# Patient Record
Sex: Female | Born: 1940
Health system: Southern US, Community
[De-identification: ages and names within clinical notes are randomized; demographics above are authoritative.]

## PROBLEM LIST (undated history)

## (undated) DIAGNOSIS — R112 Nausea with vomiting, unspecified: Secondary | ICD-10-CM

## (undated) DIAGNOSIS — I252 Old myocardial infarction: Secondary | ICD-10-CM

## (undated) DIAGNOSIS — J84112 Idiopathic pulmonary fibrosis: Secondary | ICD-10-CM

## (undated) DIAGNOSIS — T8859XA Other complications of anesthesia, initial encounter: Secondary | ICD-10-CM

## (undated) DIAGNOSIS — I10 Essential (primary) hypertension: Secondary | ICD-10-CM

## (undated) DIAGNOSIS — T4145XA Adverse effect of unspecified anesthetic, initial encounter: Secondary | ICD-10-CM

## (undated) DIAGNOSIS — Z9889 Other specified postprocedural states: Secondary | ICD-10-CM

## (undated) DIAGNOSIS — I2510 Atherosclerotic heart disease of native coronary artery without angina pectoris: Secondary | ICD-10-CM

## (undated) DIAGNOSIS — E785 Hyperlipidemia, unspecified: Secondary | ICD-10-CM

## (undated) DIAGNOSIS — J309 Allergic rhinitis, unspecified: Secondary | ICD-10-CM

## (undated) DIAGNOSIS — K219 Gastro-esophageal reflux disease without esophagitis: Secondary | ICD-10-CM

## (undated) HISTORY — DX: Atherosclerotic heart disease of native coronary artery without angina pectoris: I25.10

## (undated) HISTORY — PX: OTHER SURGICAL HISTORY: SHX169

## (undated) HISTORY — DX: Allergic rhinitis, unspecified: J30.9

## (undated) HISTORY — DX: Hyperlipidemia, unspecified: E78.5

## (undated) HISTORY — DX: Idiopathic pulmonary fibrosis: J84.112

## (undated) HISTORY — DX: Gastro-esophageal reflux disease without esophagitis: K21.9

## (undated) HISTORY — PX: GALLBLADDER SURGERY: SHX652

## (undated) HISTORY — PX: ABDOMINAL HYSTERECTOMY: SHX81

## (undated) HISTORY — DX: Old myocardial infarction: I25.2

## (undated) HISTORY — PX: BLADDER SURGERY: SHX569

## (undated) HISTORY — DX: Essential (primary) hypertension: I10

---

## 2000-07-02 ENCOUNTER — Encounter (INDEPENDENT_AMBULATORY_CARE_PROVIDER_SITE_OTHER): Payer: Self-pay | Admitting: Specialist

## 2000-07-02 ENCOUNTER — Ambulatory Visit (HOSPITAL_COMMUNITY): Admission: RE | Admit: 2000-07-02 | Discharge: 2000-07-02 | Payer: Self-pay | Admitting: *Deleted

## 2003-04-11 ENCOUNTER — Inpatient Hospital Stay (HOSPITAL_COMMUNITY): Admission: EM | Admit: 2003-04-11 | Discharge: 2003-06-23 | Payer: Self-pay | Admitting: Cardiology

## 2003-04-11 ENCOUNTER — Encounter: Payer: Self-pay | Admitting: Cardiology

## 2003-04-12 ENCOUNTER — Encounter: Payer: Self-pay | Admitting: Thoracic Surgery (Cardiothoracic Vascular Surgery)

## 2003-04-12 ENCOUNTER — Encounter: Payer: Self-pay | Admitting: Cardiology

## 2003-04-12 ENCOUNTER — Encounter: Payer: Self-pay | Admitting: Cardiovascular Disease

## 2003-04-13 ENCOUNTER — Encounter: Payer: Self-pay | Admitting: Pulmonary Disease

## 2003-04-14 ENCOUNTER — Encounter: Payer: Self-pay | Admitting: Pulmonary Disease

## 2003-04-15 ENCOUNTER — Encounter: Payer: Self-pay | Admitting: Cardiology

## 2003-04-16 ENCOUNTER — Encounter: Payer: Self-pay | Admitting: Cardiology

## 2003-04-18 ENCOUNTER — Encounter: Payer: Self-pay | Admitting: Cardiology

## 2003-04-19 ENCOUNTER — Encounter: Payer: Self-pay | Admitting: Cardiology

## 2003-04-20 ENCOUNTER — Encounter: Payer: Self-pay | Admitting: Cardiology

## 2003-04-21 ENCOUNTER — Encounter: Payer: Self-pay | Admitting: Cardiology

## 2003-04-21 ENCOUNTER — Encounter: Payer: Self-pay | Admitting: Pulmonary Disease

## 2003-04-22 ENCOUNTER — Encounter: Payer: Self-pay | Admitting: Pulmonary Disease

## 2003-04-23 ENCOUNTER — Encounter: Payer: Self-pay | Admitting: Cardiology

## 2003-04-24 ENCOUNTER — Encounter: Payer: Self-pay | Admitting: Cardiology

## 2003-04-26 ENCOUNTER — Encounter: Payer: Self-pay | Admitting: Pulmonary Disease

## 2003-04-27 ENCOUNTER — Encounter: Payer: Self-pay | Admitting: Cardiology

## 2003-04-28 ENCOUNTER — Encounter: Payer: Self-pay | Admitting: Pulmonary Disease

## 2003-04-28 ENCOUNTER — Encounter: Payer: Self-pay | Admitting: Cardiology

## 2003-04-29 ENCOUNTER — Encounter: Payer: Self-pay | Admitting: Pulmonary Disease

## 2003-04-30 ENCOUNTER — Encounter: Payer: Self-pay | Admitting: Cardiology

## 2003-05-01 ENCOUNTER — Encounter: Payer: Self-pay | Admitting: Pulmonary Disease

## 2003-05-02 ENCOUNTER — Encounter: Payer: Self-pay | Admitting: Cardiology

## 2003-05-03 ENCOUNTER — Encounter: Payer: Self-pay | Admitting: Pulmonary Disease

## 2003-05-04 ENCOUNTER — Encounter: Payer: Self-pay | Admitting: Cardiology

## 2003-05-05 ENCOUNTER — Encounter: Payer: Self-pay | Admitting: Cardiology

## 2003-05-06 ENCOUNTER — Encounter: Payer: Self-pay | Admitting: Cardiology

## 2003-05-07 ENCOUNTER — Encounter: Payer: Self-pay | Admitting: Cardiology

## 2003-05-08 ENCOUNTER — Encounter: Payer: Self-pay | Admitting: Critical Care Medicine

## 2003-05-09 ENCOUNTER — Encounter: Payer: Self-pay | Admitting: Pulmonary Disease

## 2003-05-10 ENCOUNTER — Encounter: Payer: Self-pay | Admitting: Pulmonary Disease

## 2003-05-10 ENCOUNTER — Encounter: Payer: Self-pay | Admitting: Cardiology

## 2003-05-11 ENCOUNTER — Encounter: Payer: Self-pay | Admitting: Cardiology

## 2003-05-12 ENCOUNTER — Encounter: Payer: Self-pay | Admitting: Pulmonary Disease

## 2003-05-13 ENCOUNTER — Encounter: Payer: Self-pay | Admitting: Pulmonary Disease

## 2003-05-14 ENCOUNTER — Encounter: Payer: Self-pay | Admitting: Cardiology

## 2003-05-15 ENCOUNTER — Encounter: Payer: Self-pay | Admitting: Pulmonary Disease

## 2003-05-15 ENCOUNTER — Encounter: Payer: Self-pay | Admitting: Cardiology

## 2003-05-15 ENCOUNTER — Encounter (INDEPENDENT_AMBULATORY_CARE_PROVIDER_SITE_OTHER): Payer: Self-pay | Admitting: *Deleted

## 2003-05-16 ENCOUNTER — Encounter: Payer: Self-pay | Admitting: Cardiology

## 2003-05-17 ENCOUNTER — Encounter: Payer: Self-pay | Admitting: Cardiology

## 2003-05-18 ENCOUNTER — Encounter: Payer: Self-pay | Admitting: Cardiology

## 2003-05-19 ENCOUNTER — Encounter: Payer: Self-pay | Admitting: Cardiology

## 2003-05-19 ENCOUNTER — Encounter: Payer: Self-pay | Admitting: Pulmonary Disease

## 2003-05-20 ENCOUNTER — Encounter: Payer: Self-pay | Admitting: Cardiology

## 2003-05-21 ENCOUNTER — Encounter: Payer: Self-pay | Admitting: Cardiology

## 2003-05-21 ENCOUNTER — Encounter: Payer: Self-pay | Admitting: Pulmonary Disease

## 2003-05-22 ENCOUNTER — Encounter: Payer: Self-pay | Admitting: Cardiology

## 2003-05-23 ENCOUNTER — Encounter: Payer: Self-pay | Admitting: Pulmonary Disease

## 2003-05-24 ENCOUNTER — Encounter: Payer: Self-pay | Admitting: Cardiology

## 2003-05-25 ENCOUNTER — Encounter: Payer: Self-pay | Admitting: Cardiology

## 2003-05-26 ENCOUNTER — Encounter: Payer: Self-pay | Admitting: Cardiology

## 2003-05-27 ENCOUNTER — Encounter: Payer: Self-pay | Admitting: Cardiology

## 2003-05-28 ENCOUNTER — Encounter: Payer: Self-pay | Admitting: Cardiology

## 2003-05-29 ENCOUNTER — Encounter: Payer: Self-pay | Admitting: Critical Care Medicine

## 2003-05-30 ENCOUNTER — Encounter: Payer: Self-pay | Admitting: Cardiology

## 2003-05-31 ENCOUNTER — Encounter: Payer: Self-pay | Admitting: Cardiology

## 2003-06-01 ENCOUNTER — Encounter: Payer: Self-pay | Admitting: Cardiology

## 2003-06-02 ENCOUNTER — Encounter: Payer: Self-pay | Admitting: Pulmonary Disease

## 2003-06-02 ENCOUNTER — Encounter: Payer: Self-pay | Admitting: Cardiology

## 2003-06-03 ENCOUNTER — Encounter: Payer: Self-pay | Admitting: Cardiology

## 2003-06-04 ENCOUNTER — Encounter: Payer: Self-pay | Admitting: Cardiology

## 2003-06-05 ENCOUNTER — Encounter: Payer: Self-pay | Admitting: Cardiology

## 2003-06-06 ENCOUNTER — Encounter: Payer: Self-pay | Admitting: Cardiology

## 2003-06-07 ENCOUNTER — Encounter: Payer: Self-pay | Admitting: Cardiology

## 2003-06-08 ENCOUNTER — Encounter: Payer: Self-pay | Admitting: Cardiology

## 2003-06-08 ENCOUNTER — Encounter: Payer: Self-pay | Admitting: Critical Care Medicine

## 2003-06-21 ENCOUNTER — Encounter: Payer: Self-pay | Admitting: Critical Care Medicine

## 2003-06-23 ENCOUNTER — Inpatient Hospital Stay (HOSPITAL_COMMUNITY)
Admission: RE | Admit: 2003-06-23 | Discharge: 2003-06-30 | Payer: Self-pay | Admitting: Physical Medicine & Rehabilitation

## 2004-08-27 ENCOUNTER — Ambulatory Visit: Payer: Self-pay | Admitting: Cardiology

## 2004-08-27 ENCOUNTER — Inpatient Hospital Stay (HOSPITAL_COMMUNITY): Admission: EM | Admit: 2004-08-27 | Discharge: 2004-08-28 | Payer: Self-pay | Admitting: Cardiology

## 2004-09-20 ENCOUNTER — Ambulatory Visit: Payer: Self-pay | Admitting: Cardiology

## 2004-12-06 ENCOUNTER — Ambulatory Visit: Payer: Self-pay | Admitting: Pulmonary Disease

## 2005-03-21 ENCOUNTER — Ambulatory Visit: Payer: Self-pay | Admitting: Pulmonary Disease

## 2005-05-14 ENCOUNTER — Ambulatory Visit: Payer: Self-pay | Admitting: Cardiology

## 2007-04-08 ENCOUNTER — Ambulatory Visit: Payer: Self-pay | Admitting: Physician Assistant

## 2007-04-09 ENCOUNTER — Inpatient Hospital Stay (HOSPITAL_BASED_OUTPATIENT_CLINIC_OR_DEPARTMENT_OTHER): Admission: RE | Admit: 2007-04-09 | Discharge: 2007-04-09 | Payer: Self-pay | Admitting: Cardiology

## 2007-04-09 ENCOUNTER — Ambulatory Visit: Payer: Self-pay | Admitting: Cardiology

## 2007-04-27 ENCOUNTER — Ambulatory Visit: Payer: Self-pay | Admitting: Cardiology

## 2007-05-10 ENCOUNTER — Ambulatory Visit: Payer: Self-pay | Admitting: Pulmonary Disease

## 2008-02-25 ENCOUNTER — Telehealth (INDEPENDENT_AMBULATORY_CARE_PROVIDER_SITE_OTHER): Payer: Self-pay | Admitting: *Deleted

## 2008-03-28 DIAGNOSIS — F329 Major depressive disorder, single episode, unspecified: Secondary | ICD-10-CM | POA: Insufficient documentation

## 2008-03-28 DIAGNOSIS — J84112 Idiopathic pulmonary fibrosis: Secondary | ICD-10-CM | POA: Insufficient documentation

## 2008-03-29 ENCOUNTER — Ambulatory Visit: Payer: Self-pay | Admitting: Internal Medicine

## 2008-03-29 DIAGNOSIS — R05 Cough: Secondary | ICD-10-CM

## 2008-03-29 DIAGNOSIS — E785 Hyperlipidemia, unspecified: Secondary | ICD-10-CM

## 2008-03-29 DIAGNOSIS — I10 Essential (primary) hypertension: Secondary | ICD-10-CM | POA: Insufficient documentation

## 2008-05-05 ENCOUNTER — Ambulatory Visit: Payer: Self-pay | Admitting: Internal Medicine

## 2008-05-15 ENCOUNTER — Telehealth (INDEPENDENT_AMBULATORY_CARE_PROVIDER_SITE_OTHER): Payer: Self-pay | Admitting: *Deleted

## 2008-05-16 ENCOUNTER — Telehealth (INDEPENDENT_AMBULATORY_CARE_PROVIDER_SITE_OTHER): Payer: Self-pay | Admitting: *Deleted

## 2009-10-18 ENCOUNTER — Ambulatory Visit (HOSPITAL_COMMUNITY): Admission: RE | Admit: 2009-10-18 | Discharge: 2009-10-18 | Payer: Self-pay | Admitting: Ophthalmology

## 2009-11-01 ENCOUNTER — Ambulatory Visit (HOSPITAL_COMMUNITY): Admission: RE | Admit: 2009-11-01 | Discharge: 2009-11-01 | Payer: Self-pay | Admitting: Ophthalmology

## 2010-12-29 LAB — BASIC METABOLIC PANEL
BUN: 13 mg/dL (ref 6–23)
CO2: 29 mEq/L (ref 19–32)
GFR calc Af Amer: >60 mL/min (ref >60)
GFR calc non Af Amer: >60 mL/min (ref >60)
Potassium: 3.7 mEq/L (ref 3.5–5.1)

## 2011-02-25 NOTE — Assessment & Plan Note (Signed)
[facility]                          [name]   JEREMIAH, TARPLEY                      MRN:          [id number]  DATE:04/27/2007                            DOB:          Mar 08, 1941    PRIMARY CARDIOLOGIST:  Learta Codding, MD,FACC.   REASON FOR VISIT:  Post catheterization follow-up.   Ms. Blume is a 70 year old female, with complex past cardiac history  well delineated by Dr. Andee Lineman during most recent clinic follow-up on  April 08, 2007.  She had not been seen in follow-up since 2006 and  presented with symptoms which were worrisome for coronary artery disease  progression.  Dr. Andee Lineman recommended an elective cardiac catheterization  and, in the meanwhile, she was placed on low dose Imdur and provided  with p.r.n. nitroglycerin.  Of note, Dr. Andee Lineman also noted the patient's  prior extensive and complex hospitalization including prolonged  mechanical ventilation and subsequent tracheostomy in the setting of  acute respiratory distress syndrome.  Dr. Andee Lineman thus felt that to some  extent the patient's persistent dyspnea was related to previous acute  lung injury and that she should resume follow-up with Dr. Billy Fischer  in [place], [place].   The right/left cardiac catheterization revealed nonobstructive coronary  artery disease, restoration of left ventricular function (50% with  minimal inferobasal hypokinesis), patency of a proximal RCA stent site,  and normal right heart pressures.   Since her procedure, patient reports no further left arm discomfort  which she now attributes to having had to lift her grandchild on several  occasions.  She also reports that her exertional dyspnea seemed to  improve markedly while she was recently vacationing at [employer].  However, since her return she seems to have had some slight worsening of  this persistent exertional dyspnea.  As before, she continues to report  no  orthopnea, PND or lower extremity edema.  The patient does not  currently smoke, but does have prior history of such.   MEDICATIONS:  1. Imdur 30 daily.  2. Protonix 40 daily.  3. Enteric coated aspirin 325 daily.  4. Potassium OTC one tablet daily.  5. Metoprolol ER 50 daily.  6. Zocor 40 daily.  7. Mirtazapine 15 mg daily.   PHYSICAL EXAMINATION:  VITAL SIGNS:  Blood pressure 116/76, pulse 76 and  regular, weight 142.  GENERAL APPEARANCE:  A 70 year old female sitting upright in no  distress.  HEENT:  Normocephalic and atraumatic.  NECK:  Palpable bilateral carotid pulses without bruits; no JVD at 90  degrees.  LUNGS:  Diminished breath sounds in bases with faint, dry, rales  particularly in the right base; no wheezes.  CARDIOVASCULAR:  Regular rate and rhythm (S1 and S2), no significant  murmurs.  ABDOMEN:  Soft and nontender.  EXTREMITIES:  Right groin stable with no ecchymosis, hematoma or bruit  on auscultation.  Palpable femoral and distal pulses.  NEUROLOGIC:  No focal deficit.   A recent chest x-ray, in preparation for cardiac catheterization,  revealed no acute abnormality with some chronic scarring of the left mid  lung region.  IMPRESSION:  1. Coronary artery disease - stable.      a.     Nonobstructive coronary artery disease with continued       patency of the proximal right coronary artery stent site by recent       cardiac catheterization.      b.     Improved left ventricular function to approximately 50%.      c.     Status post acute inferior myocardial infarction with right       ventricular involvement and a cardiogenic shock in June 2004,       treated with emergent Hepacoat stenting of 100% proximal right       coronary artery.  2. Status post acute respiratory distress syndrome.      a.     Requiring prolonged mechanical ventilation/tracheostomy.  3. Hypertension.  4. History of tobacco.  5. Dyslipidemia.      a.     Followed by Dr. Reuel Boom.    PLAN:  1. No further cardiac work-up indicated at this time.  Continued      medical management is recommended with the exception of Imdur, in      light of the recent cardiac catheterization results showing no      significant obstructive disease.  Patient is also requesting      preference to not take this medication on a regular basis.  2. We will help arrange a follow-up appointment with Dr. Billy Fischer      for reevaluation of the patient's persistent dyspnea, and in light      of the fact that patient had previously been followed by him in      clinic in [place].  3. Schedule return clinic follow-up with myself and Dr. Andee Lineman in      approximately six months.      Gene Serpe, PA-C  Electronically Signed      Learta Codding, MD,FACC  Electronically Signed   GS/MedQ  DD: 04/27/2007  DT: 04/28/2007  Job #: [id number]

## 2011-02-25 NOTE — Cardiovascular Report (Signed)
[name], [name] NO.:  [id number]   MEDICAL RECORD NO.:  [id number]          PATIENT TYPE:  OIB   LOCATION:  1966                         FACILITY:  [facility]   PHYSICIAN:  Arturo Morton. Riley Kill, MD, FACCDATE OF BIRTH:  1941/08/19   DATE OF PROCEDURE:  04/09/2007  DATE OF DISCHARGE:                            CARDIAC CATHETERIZATION   INDICATIONS:  [name] is a delightful 70 year old woman who presented  4 years ago with cardiogenic shock, RV infarction, and an occluded right  coronary artery who underwent successful percutaneous stenting.  At the  time she developed acute respiratory distress syndrome and had a  complicated hospital course.  She eventually recovered.  She has had  recent left arm discomfort, and because of this she was seen by Dr.  Andee Lineman and subsequently referred for diagnostic right and left heart  catheterization.  The patient was brought to the same day cath area and  the case discussed.  She was agreeable to proceed.   PROCEDURES:  1. Right and left heart catheterization.  2. Selective coronary arteriography.  3. Selective left ventriculography.   DESCRIPTION OF PROCEDURE:  The patient was brought to the  catheterization laboratory, prepped and draped in the usual fashion.  Through an anterior puncture, the femoral vein was entered.  A 7-French  sheath was placed.  A Swan-Ganz catheter was then passed to the superior  vena cava where a saturation was obtained.  Sequential right heart  pressures were then measured including pulmonary capillary wedge  pressure.  Following this, pulmonary artery saturation was obtained.  Thermodilution cardiac outputs were then performed.  The right femoral  artery was then entered using an anterior puncture and a 4-French sheath  was placed.  A pigtail catheter was placed in the central aorta and left  ventricle.  Pressures were measured.  There was some difficulty  aspirating the left heart catheter, and  as a result this was then  removed.  A new pigtail catheter was obtained.  The new pigtail catheter  was then placed in the LV, pressures again recorded, and  ventriculography performed in the RAO projection.  She tolerated this  well.  Following this, views of the right coronary artery were obtained  with a no-toe 4-French catheter, then left coronary arteriography was  performed using eventually a JL-5 catheter for better fit.  The patient  tolerated the procedure well.  All catheters were subsequently removed  and the femoral sheath was sewn into place.  I then reviewed the films  including the new and old films with the patient's family.  She was  taken to the holding area in satisfactory clinical condition after  direct manual hemostasis.   HEMODYNAMIC DATA:  1. Right atrial pressure 5.  2. RV 28/5.  3. Pulmonary artery 27/7, mean 15.  4. Pulmonary capillary wedge 7.  5. LV 138/9.  6. Aortic 132/69, mean 95.  7. No gradient on pullback across the aortic valve.  8. Pulmonary artery saturation 58%.  9. Superior vena cava saturation 54%.  10.Aortic saturation 90%.  11.Fick cardiac output 2.5 liters per minute.  12.Fick cardiac index 1.5 liters per minute per meter squared.  13.Thermodilution cardiac output 3.8 liters per minute.  14.Thermodilution cardiac index 2.3 liters per minute per meter      squared.   ANGIOGRAPHIC DATA.:  1. Ventriculography was done in the RAO projection.  There was perhaps      trace mitral regurgitation but this did not appear to be      significant.  Overall, LV function was preserved with an ejection      fraction in excess of 50%.  There was perhaps very mild inferobasal      hypokinesis, but improved from the acute study.  The remainder of      the segmental wall motion was normal.  2. The right coronary artery is a large dominant vessel.  It is      previously stented in the mid vessel by fluoroscopy.  There is 20%      proximal narrowing  within the stent itself.  There does not appear      to be any luminal narrowing more than 30%.  The mid and distal      vessel has mild luminal irregularity but no critical stenoses.  The      PDA bifurcates.  The posterolateral branch is moderate in size.      There is no obvious critical disease in the right coronary      distribution.  3. The left main is free of critical disease.  4. The left anterior descending artery courses to the apex.  On the      RAO cranial views, there is some mild irregularity in the LAD at      the ostium of about 30%.  The proximal left anterior descending      artery, however, does not appear to be critical, and is seen in      both the LAO caudal and RAO cranial views.  The mid vessel has an      additional area of 30% narrowing which again does not appear to be      flow-limiting.  The distal LAD has mild luminal irregularity and      wraps the apex.  5. There is a ramus intermedius that has probably 30% to 40% proximal      narrowing and then opens up.  There is a secondary lesion of about      30% as well.  Critical stenosis is not noted, although the distal      vessel is fairly large in caliber compared to the proximal vessel      which I suspect is segmentally plaqued.  6. The circumflex proper provides an insignificant marginal branch      followed by a large bifurcating marginal branch.  Overlapping this      is about a 30-40% narrowing at the large branch bifurcation.  The      more inferior branch has about 20-30% narrowing distally.   CONCLUSION:  1. Recovery of left ventricular function.  2. Normal right heart pressures.  3. Patency of the previous stent.  4. Scattered disease of the left coronary system without critical      stenoses.   PLAN:  1. We will recommend followup with Dr. Andee Lineman.  2. The patient may require pulmonary evaluation.      Arturo Morton. Riley Kill, MD, [facility] Electronically Signed     TDS/MEDQ  D:  04/09/2007  T:   04/09/2007  Job:  [id number]   cc:  Noah Charon, MD,FACC  CV Laboratory

## 2011-02-25 NOTE — Assessment & Plan Note (Signed)
[facility]                          [name]   [name], [name]                      MRN:          [id number]  DATE:04/08/2007                            DOB:          January 09, 1941    HISTORY OF PRESENT ILLNESS:  The patient is a pleasant 70 year old  female with known coronary artery disease.  The patient has a history of  acute inferior wall myocardial infarction with right ventricular  involvement presenting with cardiogenic shock in 2004.  She required  emergent PCI to a proximally occluded RCA.  She also has had residual  mild-to-moderate coronary artery disease of the LAD and circumflex.  She  had a followup nuclear imaging study done which did not demonstrate  ischemia approximately 6 months later.  The patient received a [employer] HEPACOAT stent.  The patient did not have any followup since her  last presentation to our office in 2006.  At that time she was not  having any symptoms of chest pain.   The patient, however, now reports that she is concerned.  She has  developed over the last several weeks to months left arm pain on  exertion associated with chest tightness.  There is also at times  dizziness and nausea which is independent of her chest pain.  In  reviewing the chart, the patient has a longstanding history of nausea  which is intermittent and can last sometimes several days.  I am not  sure as to what workup she has received in the past.  The patient also  reports increased shortness of breath on minimal exertion.  Of note is  that this patient at the time of her presentation was critically ill.  She had a prolonged hospitalization for approximately 3 months.  She was  on prolonged mechanical ventilation and developed acute respiratory  distress syndrome requiring prolonged ventilation and tracheostomy.  She  still follows with Dr. Sung Amabile, although she has not seen him recently.  She has known  residual lung injury related to her ARDS.  She denies,  however, any orthopnea, PND.  She has no palpitations or syncope.  The  patient states that earlier this morning when coming up to our practice,  she developed 2/10 chest tightness upon exertion.  She denies, however,  any pain at rest.  EKG in the office today also demonstrates no  ischemia, no significant ST-T wave changes.  There is poor R wave  progression, however.   MEDICATIONS:  1. Mirtazapine 15 mg p.o. daily.  2. Zocor 40 mg daily.  3. Metoprolol ER 50 mg daily.  4. Potassium 550 mcg daily.  5. Enteric-coated aspirin 325 mg daily.  6. Vitamin.  7. Protonix 40 mg p.o. daily.   PHYSICAL EXAMINATION:  VITAL SIGNS:  Blood pressure 151/93, heart rate  60 beats per minute.  Weight 144 pounds.  GENERAL:  Well-nourished white female in no apparent distress.  HEENT:  Normal.  NECK:  Normal carotid upstrokes and no carotid bruits.  JVD is  approximately 6 to 7 cm.  LUNGS:  Somewhat diminished breath sounds  but no wheezing or crackles.  HEART:  Regular rate and rhythm with normal S1 and S2.  No murmurs,  rubs, or gallops.  ABDOMEN:  Soft, nontender.  No rebound or guarding.  Good bowel sounds.  EXTREMITIES:  No clubbing, cyanosis, or edema.  NEUROLOGIC:  The patient is alert and oriented.  Grossly nonfocal.   PROBLEMS:  1. Recurrent substernal chest pain.      a.     Coronary artery disease.  See details above.      b.     History of inferior wall myocardial infarction with right       ventricular involvement.      c.     Status post HEPACOAT stent.      d.     Negative nuclear imaging study 6 months post-myocardial       infarction.  2. Hypertension, poorly controlled.  3. History of tobacco use, discontinued.  4. History of acute respiratory distress syndrome and tracheostomy.  5. Dyslipidemia.   PLAN:  1. The patient symptoms are concerning for worsening ischemic heart      disease.  I gave the patient the  opportunity to proceed with stress      testing or catheterization, but I made it clear that my preference      is the latter approach.  The patient is comfortable with this, and      we will plan on a JV catheterization tomorrow.  I do not think that      she has unstable symptoms, and her symptoms are still exertional-      related.  I will provide her with Imdur and p.r.n. nitroglycerin.      I did tell the patient, however, if she has chest pain at rest      which is prolonged in nature, she will need to present herself      acutely to the emergency room.  2. Will obtain lipid panel and additional blood testing in preparation      for cardiac catheterization.  3. I do think that part of the patient's dyspnea may well be related      to her prior history of acute lung injury in the setting of      cardiogenic shock.  She will also need a followup appointment with      Dr. Sung Amabile.  Will add a right heart catheterization to the      procedure to make sure that she does not have significant pulmonary      hypertension.   I have discussed the risks and benefits of the procedure with the  patient, and she is willing to proceed.     Learta Codding, MD,FACC  Electronically Signed    GED/MedQ  DD: 04/08/2007  DT: 04/08/2007  Job #: [id number]

## 2011-02-28 NOTE — H&P (Signed)
NAME:  [name], [name]                         ACCOUNT NO.:  [id number]   MEDICAL RECORD NO.:  [id number]                   PATIENT TYPE:  INP   LOCATION:  3104                                 FACILITY:  [facility]   PHYSICIAN:  Arturo Morton. Riley Kill, M.D.             DATE OF BIRTH:  June 02, 1941   DATE OF ADMISSION:  04/11/2003  DATE OF DISCHARGE:  06/08/2003                                HISTORY & PHYSICAL   HISTORY OF PRESENT ILLNESS:  Ms. Hirth is a 70 year old female presenting to  [facility] with a 24 hour history of chest pain.  Her hemodynamics  were found to be unstable on route to the hospital and she required emergent  intubation at [facility].  She was stabilized on  transfer with vasopressors for bradycardia and hypotension.  She was acutely  transferred to [facility] in cardiogenic shock with the diagnosis  of an acute inferior myocardial infarction.  She was set up on arrival here  for emergent left heart catheterization.  Once again, the patient is  intubated and unresponsive.   ALLERGIES:  1. CODEINE.  2. SULFA.  3. PENICILLIN.   MEDICATIONS:  1. Tricor 160 mg daily.  2. Hydrochlorothiazide 25 mg daily.  3. Premarin 1.25 mg daily.  4. Lisinopril 10 mg daily.  5. Zetia 10 mg daily.   PAST MEDICAL HISTORY:  1. History of anemia.  2. Hypertension.  3. Dyslipidemia.  4. COPD with history of tobacco habituation.  5. Heart murmur.   PAST SURGICAL HISTORY:  1. Status post cholecystectomy 12 years ago.  2. Status post bladder suspension in 1999.  3. Status post tumor resection, left breast.   SOCIAL HISTORY:  The patient is currently laid off.  She is married.  She  has a history of smoking.   FAMILY HISTORY:  Mother died in her early 37s of old age.  Father has a  history of Alzheimer's.  She had one brother who died of complications of  liver problems.  One sister died with a history of cancer.   REVIEW OF SYSTEMS:  Unable  to obtain from the patient at this time as she is  intubated.   PHYSICAL EXAMINATION:  VITAL SIGNS:  Weight 154, white cells 19.5,  hemoglobin 9.2, hematocrit 26.9, platelets 297.  PT is 14.8, INR 1.4,  glucose 274, potassium 3.2.  She has been given aspirin and Plavix,  Integrilin as well, and has been started on IV heparin 800 units per hour.  Again, review of systems is unobtainable since the patient is intubated.  EXTREMITIES:  Pulses are intact.  ABDOMEN:  Soft.  HEART:  She has intermittent pacing.  Blood pressure is in the 60s systolic.   IMPRESSION:  1. Acute inferior wall myocardial infarction.  2. Cardiogenic shock/hypotension.  3. History of hypertension.  4. History of tobacco habituation.  5. Dyslipidemia.   PLAN:  Emergent left  heart catheterization with hemodynamic support intra  and post catheterization.     Maple Mirza, P.A.                    Arturo Morton. Riley Kill, M.D.    GM/MEDQ  D:  09/12/2003  T:  09/12/2003  Job:  [id number]

## 2011-02-28 NOTE — Discharge Summary (Signed)
NAME:  [name], [name]               ACCOUNT NO.:  [id number]   MEDICAL RECORD NO.:  [id number]          PATIENT TYPE:  INP   LOCATION:  6527                         FACILITY:  [facility]   PHYSICIAN:  Willa Rough, M.D.     DATE OF BIRTH:  15-Aug-1941   DATE OF ADMISSION:  08/27/2004  DATE OF DISCHARGE:  08/28/2004                                 DISCHARGE SUMMARY   DISCHARGE DIAGNOSIS:  Chest pain with negative cardiac enzymes status post  cardiac catheterization showing mild to moderate coronary atherosclerosis  with patent stent site in proximal right coronary artery, approximate 60%  stenosis in mid circumflex, 50% stenosis involving a high diagonal branch,  30% stenosis in the left anterior descending, also somewhat hazy appearance  to the mid circumflex stenosis, normal left ventricular ejection fraction in  the 70% range in the setting of significant ventricular ectopy.   PAST MEDICAL HISTORY:  Acute inferior myocardial infarction, acute  respiratory distress syndrome resulting in tracheotomy in September 2004,  multiple medical problems resulting in deconditioning, history of  nutritional complications secondary to above diagnoses, metabolic  encephalopathy and steroid-induced diabetes, hyperlipidemia, anemia,  cardiogenic shock in June 2004, COPD with history of tobacco abuse, status  post cholecystectomy 12 years ago, status post bladder suspension in 1999,  status post tumor resection left breast.   HISTORY OF PRESENT ILLNESS:  This is a 70 year old Caucasian female with a  history of known CAD as stated above, who presented the day prior to  admission to [facility] with bilateral arm pain which woke  her from sleep, progressive chest pressure and tightness described as 10/10.  Her first set of enzymes were negative.  It was felt best that the patient  be transferred to [facility] due to her critical past  medical history recently.   HOSPITAL COURSE:  The patient was transferred to [facility] under the care of Dr. Myrtis Ser and scheduled for cardiac cath on  November 15, was placed on an Integrilin drip, continued Imdur, Protonix,  Zocor, prednisone, vitamins, albuterol and Lopressor.  The patient was  cathed by Dr. Diona Browner, results as stated above.  Dr. Diona Browner reviewed the  films with Dr. Antoine Poche.  It was felt that there was a transient plaque  rupture event in the mid circumflex, although cardiac markers were negative.  The residual stenosis did not appear to be critical enough to be causing  rest symptoms at present.  The plan was to continue to observe the patient,  discontinue the Integrilin, begin heparin six hours after sheath was pulled,  and follow up with serial cardiac markers.  D-dimer was also ordered.  Lab  work showed a D-dimer of 0.32, troponin less than 0.01, potassium 3.9, BUN  13, creatinine 0.8, glucose 96.  White blood cell count 9.2, hemoglobin  11.6, hematocrit 33.2, platelet count 233,000.  Dr. Diona Browner felt we might  consider outpatient Cardiolite in the future if the patient continues to  have problems.  On November 16 the cath site in the right groin showed no  hematoma,  enzymes negative x2 here, negative x1 at [facility].  D-dimer was  within normal limits.  The patient states she feels much better.  Dr. Myrtis Ser  was in to see the patient.  The plan is to ambulate the patient and if she  tolerates without any discomfort we will discharge her home later today.   DISCHARGE MEDICATIONS:  Zocor 40 mg daily, Protonix 40 mg daily, Lopressor  50 mg daily, Imdur 30 mg daily, prednisone 5 mg daily, albuterol inhalers as  needed as prior to admission, enteric-coated aspirin 325 mg daily.  The  patient is to continue other medications including Remeron, vitamins, etc.  as before.  Pain management:  Tylenol for general discomfort, nitroglycerin  for chest discomfort.   ACTIVITY:  No  driving or strenuous activity for two days.  No lifting over  10 pounds for one week.   DIET:  She is to follow a low fat, low salt diet.   SPECIAL INSTRUCTIONS:  Wound care:  Gently clean the cath site with soap and  water.  No tub bathing for one week.  She is to call our office for any  fever, pain or swelling at the cath site.   FOLLOWUP:  She has a followup appointment at our [place], [place] office  in 1-2 weeks.  At that time, if the patient is still having problems, we  will schedule an outpatient Cardiolite.       MB/MEDQ  D:  08/28/2004  T:  08/28/2004  Job:  [id number]

## 2011-02-28 NOTE — Consult Note (Signed)
NAME:  [name], [name]                         ACCOUNT NO.:  [id number]   MEDICAL RECORD NO.:  [id number]                   PATIENT TYPE:  INP   LOCATION:  2922                                 FACILITY:  [facility]   PHYSICIAN:  Charlynne Pander, D.D.S.          DATE OF BIRTH:  [birth year]   DATE OF CONSULTATION:  05/19/2003  DATE OF DISCHARGE:                                   CONSULTATION   REQUESTING PHYSICIAN:  Tawny Asal, MD   [name] is a 70 year old female referred by Dr. Tawny Asal  for dental consultation.  The patient was admitted on April 11, 2003, with an  acute myocardial infarction.  The patient received a cardiac catheterization  and angioplasty and stenting of the right coronary artery on April 11, 2003.  The patient subsequently experienced ventilator-dependent respiratory  failure and has now had a tracheostomy placed on April 27, 2003, by Dr.  Pollyann Kennedy.  Dental consultation was requested for evaluation of the patient's  dentition.   PAST MEDICAL HISTORY:  1. Coronary artery disease.     a. Status post acute myocardial infarction on April 11, 2003.     b. Status post cardiac catheterization with Dr. Riley Kill which revealed        acute inferior myocardial infarction complicated by cardiogenic shock        due to probable biventricular infarction.  Patient with 100% occlusion        of the right coronary artery which was treated with angioplasty and        stenting.  Ejection fraction was 50-55% at that time.  2. History of anemia.  3. Hypertension.  4. History of dyslipidemia.  5. Chronic obstructive pulmonary disease.  6. History of thrombocytopenia - this admission.  7. Heart murmur.  8. Status post cholecystectomy approximately 12 years ago.  9. Status post bladder tack surgery in 1999.  10.      Status post tumor resection of the breast.   ALLERGIES/ REACTIONS:  1. CODEINE.  2. SULFA.  3. PENICILLIN.   MEDICATIONS:  1. Aspirin 325 mg  daily.  2. Regular insulin per sliding scale.  3. Prevacid 30 mg every 12 hours.  4. Iron sulfate 300 mg three times daily.  5. Plavix 75 mg daily.  6. Lovenox 40 mg daily.  7. Zocor 20 mg every evening.  8. Lopressor 25 every six hours.  9. Lantus insulin 50 units at bedtime.  10.      Xopenex per inhalation therapy every 8 hours.  11.      Cromolyn sodium inhalation therapy every 8 hours.  12.      Solu-Medrol 40 mg every 12 hours.  13.      Xanax 0.25 mg every six hours.  14.      Remeron 30 mg at bedtime.  15.      Fentanyl patch 25 mcg every 72 hours.  16.  Cipro 400 mg IV every 8 hours.  17.      Jevity per orders.   SOCIAL HISTORY:  The patient is married.  Patient with a history of smoking.   FAMILY HISTORY:  Mother died in her 61s of old age.  Father with a history  of Alzheimer's.   FUNCTIONAL ASSESSMENT:  The patient remains dependent for all ADLs at this  time.   REVIEW OF SYSTEMS:  This is reviewed from the chart and health history  assessment form from this admission.   DENTAL HISTORY:  CHIEF COMPLAINT:  Dental consultation, request evaluation  of dentition.   HISTORY OF PRESENT ILLNESS:  The patient with acute myocardial infarction  and is currently ventilator-dependent.  Patient with a previous  tracheostomy.  Dental consultation was requested for evaluation of poor  dentition.   The patient is unable to provide dental history.  The patient's husband is  present in the room and indicates that the patient's last dental treatment  was approximately one year ago for a dental cleaning.  The patient's husband  is unaware of any unmet dental needs.  The patient's husband indicates that  she has had a previous upper denture.   DENTAL EXAMINATION:  GENERAL:  The patient is a well-developed, well-  nourished female lying in the hospital bed with tracheostomy in place.  HEAD/NECK:  There is no significant lymphadenopathy noted at this time.  INTRAORAL EXAM:   The patient has normal saliva.  There is no evidence of  abscess formation.  All soft tissues appear to be healthy at this time.  There is no evidence of soft tissue pathology.  PERIODONTAL:  Patient with evidence of generalized interval recession and  chronic periodontitis.  Patient with some plaque accumulations, although it  is minimal and the oral hygiene is relatively good considering the patient's  medical condition.  There is incipient tooth mobility of the mandibular  anterior teeth but no significant tooth mobility which would relate to risk  for aspiration.  DENTAL CARIES:  Dental caries are noted to be affecting the lower right  premolar.  This does not appear to be very extensive and could be evaluated  in the future once the patient is more medically stable.  DENTITION:  Patient with multiple missing teeth.  The patient is missing all  maxillary teeth and multiple teeth on the lower with the exception of six  mandibular anterior teeth.   ASSESSMENT:  1. No significant risk for aspiration at this time.  2. Chronic periodontitis with bone loss.  3. Incipient tooth mobility in the mandibular anterior teeth.  4. Incipient dental caries of the lower right premolar.  5. Multiple missing teeth.  6. Generalized relatively good oral hygiene.  7. The patient is edentulous on the maxilla.  8. Questionable history of previous denture fabrication.    PLAN/RECOMMENDATION:  1. I discussed the risks, benefits and complications with the patient's     husband and the nurse.  The patient is at minimal risk for aspiration at     this time due to tooth mobility.  The dental caries affecting the patient     should be amenable to treatment and once the patient is medically stable.     This tooth is not at risk for fracturing due to the dental caries.  There     is no evidence of abscess formation at this time.  There is no evidence    of risk for a dental abscess.  The patient can be  monitored and if acute     problems do arise, Dental Medicine can be re-consulted for evaluation for     acute dental needs.  In the meantime, I would maintain oral hygiene as     best possible using chlorhexidine rinses and assistance in maintenance of     the oral hygiene as best possible.  2. Discussion of findings with medical team as indicated.  3. Please re-consult Dental Medicine if acute dental problems arise for the     rest of this admission.  The patient should follow up with a private     dentist of her choice for evaluation once medically stable.  Will assist     in referral to another dentist as indicated.                                               Charlynne Pander, D.D.S.    RFK/MEDQ  D:  05/19/2003  T:  05/20/2003  Job:  [id number]   cc:   Tawny Asal, MD

## 2011-02-28 NOTE — Op Note (Signed)
   NAME:  [name], MCGHIE                         ACCOUNT NO.:  [id number]   MEDICAL RECORD NO.:  [id number]                   PATIENT TYPE:  INP   LOCATION:  0160                                 FACILITY:  [facility]   PHYSICIAN:  Lina Sar, M.D. [facility]               DATE OF BIRTH:  [birth year]   DATE OF PROCEDURE:  DATE OF DISCHARGE:                                 OPERATIVE REPORT   PROCEDURE:  Percutaneous endoscopic gastrostomy.   INDICATIONS:  This 70 year old white female with severe neurological deficit  has been depending on tube feedings.  The plans were made for long-term  nutritional support through percutaneous gastrostomy because of inability to  swallow.  She is undergoing placement of PEG after discussing this with her  family.   ENDOSCOPE:  Olympus single-channel videoendoscope.   SEDATION:  Versed 7 mg IV, fentanyl 75 mcg IV.   FINDINGS:  Olympus single-channel videoscope passed routinely through the  posterior pharynx into esophagus.  The patient was monitored by pulse  oximetry.  She was on the ventilator.  Her respirations were regular.  The  esophageal mucosa was normal.  The squamocolumnar junction was unremarkable.  There was a small hiatal hernia.  Gastric folds were unremarkable.  Pyloric  outlet and duodenum were wide open.  Minor erythema was noted in the  descending duodenum.   Skin was infiltrated with 1% Xylocaine after translumination of the  abdominal bulb with an endoscope.  After topical anesthetic, a small  incision was made in the skin and Seldinger needle passed into the gastric  lumen under direct vision.  Through the Seldinger needle guidewire was  placed and pulled back through the endoscope and through the esophagus  outside of the mouth.  The gastrostomy tube was then placed over the  guidewire with the tapered end first and advanced through the stomach and  through the gastrostomy opening out.  Mushroom ________over the gastrostomy  tube was  snug against the abdominal wall.  Retention disks and adapters were  placed.  The patient tolerated the procedure well.   IMPRESSION:  Placement of 24-French percutaneous gastrostomy.   PLAN:  Routine orders have been written for antireflux measures, skin care,  as well as resumption of the tube feedings tomorrow morning.                                               Lina Sar, M.D. [facility]    DB/MEDQ  D:  06/13/2003  T:  06/13/2003  Job:  [id number]

## 2011-02-28 NOTE — Cardiovascular Report (Signed)
NAME:  [name], [name]                         ACCOUNT NO.:  [id number]   MEDICAL RECORD NO.:  [id number]                   PATIENT TYPE:  INP   LOCATION:  2861                                 FACILITY:  [facility]   PHYSICIAN:  Arturo Morton. Riley Kill, M.D.             DATE OF BIRTH:  Mar 31, 1941   DATE OF PROCEDURE:  04/11/2003  DATE OF DISCHARGE:                              CARDIAC CATHETERIZATION   INDICATIONS:  The patient is a 70 year old who has been under significant  stress.  Yesterday they buried the patient's son.  She had discomfort in her  arms throughout the day as well as some shortness of breath.  This morning  she was found poorly responsive by her daughter, brought into the emergency  room, where she was emergently intubated.  She was taken to the emergency  room at [facility], where she was found to be in heart block and acute  inferior MI.  A temporary transvenous pacer was attempted to be passed  there.  She was subsequently transferred emergently to the [facility]  catheterization lab.   PROCEDURES:  1. Left heart catheterization.  2. Selective coronary arteriography.  3. Selective left ventriculography.  4. Intra-aortic balloon pump insertion.  5. Temporary transvenous pacer insertion.  6. Percutaneous transluminal coronary angioplasty and stent of the right     coronary artery.   DESCRIPTION OF PROCEDURE:  The patient was brought to the catheterization  lab and prepped and draped in the usual fashion.  The internal jugular  pacemaker was not capturing.  Through a right femoral vein approach, a 5  [place] Daig pacing wire was passed to the RV apex.  With this there was good  contact, and subsequently the patient was paced with good capture.  Right  femoral artery was then entered.  ACTs were checked.  Then a 7 French sheath  was placed.  Views of the right and left coronary arteries were obtained.  Ventriculography was then obtained.  Preparations were then made for intra-  aortic balloon pump insertion because of what appeared to be right  ventricular infarction with cardiogenic shock.  Intravenous fluids were  turned up to 500 mL/hr.  We slowly began to attempt to taper the dopamine  and Levophed which the patient had come down on.  Intra-aortic balloon pump  was inserted without difficulty.  The 34 mL pump was placed.  This was  instituted at 1:1.  Integrilin was given according to protocol.  The patient  had not had aspirin nor Plavix. Using a JR4 guiding catheter, the right  coronary artery was entered and the lesion was then easily crossed with a  0.014 high-torque floppy wire.  We then opened the artery with the 2 mm  balloon and subsequently a 28 x 2.5 [employer] Hepacoat stent was  then placed across the area and taken up to 14 atmospheres.  This was then  post-dilated using a 2.75 PowerSail balloon.  There was marked improvement  in the appearance of the artery with establishment of TIMI-3 flow.  There  was some improvement in rhythm and gradual resumption of some sinus rhythm.  The patient was having short runs of nonsustained VT.  We were able to cut  down on the dopamine and Levophed to some extent, and we continued the intra-  aortic balloon pumping.  Heparin had been given according to protocol and  ACTs checked.  We gave a tiny dose of intravenous beta blockade to try to  alleviate some of the arrhythmia.  The patient remained intubated.  Blood  gases were checked.  The guiding catheter was subsequently removed.  Sheaths  were sewn into place, as was the temporary wire.  The patient was taken to  the catheterization lab holding area in critical but slightly improved  condition.   HEMODYNAMIC DATA:  1. Initial central aortic pressure 100/57, mean 51 with pacing.  2. Left ventricle 82/16.  3. No gradient on pullback across the aortic valve.   ANGIOGRAPHIC DATA:  1. Ventriculography was performed in the RAO projection.  There was      inferobasal hypokinesis with ejection fraction estimated at 50-55%.     There did not appear to be significant mitral regurgitation.  2. The left main was free of critical disease.  3. The LAD coursed to the apex.  There was probably 30% segmental plaquing     in the mid-LAD.  It was difficult to be absolutely certain because of the     degree of vasoconstriction associated with the patient's dopamine and     Levophed.  4. There was a ramus intermedius that appeared to have about 70% mid-     segmental plaquing.  5. The circumflex appeared to have about 30-40% overlying the origin of the     AV circumflex leading into the marginal branch.  6. The right coronary artery was totally occluded proximally.  There was     faint collateralization distally.  Following reperfusion therapy, the     100% was reduced to 0% with the 2.5 mm stent taken up to 2.82.  There was     TIMI-0 flow at the beginning of the procedure and TIMI-3 flow at the     completion of the procedure.   CONCLUSION:  1. Acute inferior myocardial infarction complicated by cardiogenic shock due     to probable right ventricular infarction.  2. Undefined anemia.  3. Hypertension.  4. Dyslipidemia.  5. Smoker.  6. Increased stress.   PLAN:  1. Temporary pacer and intra-aortic balloon pump as described above.  2. Reperfusion therapy.  3. Discontinuation of smoking.  4. Cardiac rehab.   DISCUSSION:  The patient is in cardiogenic shock, hopefully will continue to  improve.  We will try to give fluids as a first-line measure and try to back  off on dopamine as tolerated.                                               Arturo Morton. Riley Kill, M.D.    TDS/MEDQ  D:  04/11/2003  T:  04/11/2003  Job:  [id number]  Donzetta Sprung  [address], [address]  [place]  [place] [address]  Fax: [phone]   Jonelle Sidle, M.D. [facility]   Cardiovascular  Laboratory   cc:   Donzetta Sprung [address], [address]  [place]  [place] [address]  Fax:  [phone]   Jonelle Sidle, M.D. [facility]   Cardiovascular Laboratory

## 2011-02-28 NOTE — Discharge Summary (Signed)
NAME:  [name], [name]                         ACCOUNT NO.:  [id number]   MEDICAL RECORD NO.:  [id number]                   PATIENT TYPE:  INP   LOCATION:  3104                                 FACILITY:  [facility]   PHYSICIAN:  Shan Levans, M.D. [facility]            DATE OF BIRTH:  June 04, 1941   DATE OF ADMISSION:  04/11/2003  DATE OF DISCHARGE:  06/08/2003                                 DISCHARGE SUMMARY   INTERIM DISCHARGE SUMMARY:   INTERIM DISCHARGE DIAGNOSES:  1. Prolonged ventilator-dependent respiratory failure secondary to acute     myocardial infarction with right ventricular infarct.  2. Fibroproliferative acute respiratory distress syndrome.  3. Ventilator-acquired pneumonia with Escherichia coli.  4. Severe debility secondary to prolonged hospitalization.   HISTORY OF PRESENT ILLNESS:  Ms. [name] is a 70 year old white female  who presented to [facility] on April 11, 2003, with a 24-hour history  of chest pain.  She was found to be cardiovascularly and hemodynamically  unstable, and she required intubation via emergency department and was  treated with vasopressors for bradycardia and hypotension.  She was acutely  transferred to [facility] via [facility] mobile critical care service  and was unstable.  She required urgent cardiac catheterization with stent of  the RCA and was found to be severely hypotensive, in cardiogenic shock,  required an intra-aortic balloon pump along with vasopressors, admission to  the intensive care unit, and has vent-dependent respiratory failure  secondary to cardiac function.  Of note, she is now on day 48 of  hospitalization.  She has been in prolonged vent-dependent respiratory  failure and has never been successfully liberated from the ventilator, and  will be transferred to the progressive pulmonary unit for long-term  liberation from mechanical ventilatory support.   LABORATORY DATA:  Sodium 141, potassium 3.7,  chloride 99, CO2 of 33, glucose  109, BUN 11, creatinine 0.3, calcium 9.3.  WBC 20.3, hemoglobin 9.1,  hematocrit 26.9, and platelets were 319.  Arterial blood gas on 40% FIO2  with a rate of 16 on pressure control mode, pH 7.45, PCO2 of 45, PO2 of 166  with a bicarb of 31.7.  INR is 1.0, PT is 13.1.  CK 17, CK-MB 1.4, troponin  I 0.02.  Respiratory culture on June 02, 2003, shows abundant E. coli that  is pansensitive.  Atypical cells from status post left thoracentesis on  May 15, 2003, demonstrates atypical cells.  Chest x-ray on June 06, 2003, demonstrates stable appearance of chest, support apparatus unchanged.  Bilateral infiltrates, left greater than right, unchanged.   PROCEDURES:  1. Oral endotracheal intubation, June 29-April 27, 2003, with a tracheostomy     placed, a #6 Shiley, on April 27, 2003, by Dr. Pollyann Kennedy, which remains in     place.  2. Left femoral intra-aortic balloon pump, June 29-April 13, 2003.  3. Right femoral artery arterial line from June 29-April 13, 2003.  4. Femoral vein __________ , June 29-April 16, 2003.  5. Right internal jugular from June 27-30, 2004.  6. Right Swan-Ganz catheter from June 30-April 16, 2003, secondary right     subclavian Ernestine Conrad from July 28-31, 2004.  7. Fiberoptic bronchoscopy on May 02, 2003.  8. Ultrasound-guided thoracentesis.  9. She also had cardiac catheterization and stenting of the RCA on     admission.   MICROBIOLOGY DATA:  Sputum culture on June 29 demonstrated Streptococcus  pneumonia, treated with Rocephin and Zosyn.  Blood cultures, she also was  treated briefly with vancomycin until culture data confirmed on methicillin-  resistant Staphylococcus aureus.  She also had on April 24, 2003, after  fiberoptic bronchoscopy 6000 units of Enterobacter.  She was treated with  Primaxin and vancomycin, with Primaxin being stopped after three days and  vancomycin being stopped after four days, changed to Cipro until complete.  EP  culture on August 20 with E. coli, currently on day six of seven days of  Cefepime.   HOSPITAL COURSE BY DISCHARGE DIAGNOSIS:  1. PROLONGED VENTILATOR-DEPENDENT RESPIRATORY FAILURE:  Ms. [name]     was admitted to [facility] after transfer from [facility]     April 11, 2003.  She had a difficult course that required her to remain on     mechanical ventilatory support secondary to acute MI with inferior and RV     infarcts.  She required intra-aortic balloon pump and vasopressor     support.  After prolonged vent-dependent respiratory support, she did     develop fibroproliferative acute respiratory distress syndrome, which was     challenged x2 with steroids and proved nonresponsive.  She continues to     remain mechanical and ventilatory support-dependent.  She is on a PRVC of     rate of 20 with a tidal volume of 420 mL, which equals 7 mL/kg, with an     FIO2 of 40% and 5 of PEEP.  She will remain on those mechanical     ventilatory settings until transferred to the progressive pulmonary unit,     at which time further adjustments will be made toward weaning her and     liberating her from mechanical ventilatory support.  2. DIABETES MELLITUS:  She has diabetes mellitus when in the hospital.  This     is being addressed with sliding scale insulin and q.12h. insulin.  3. CHRONIC CRITICAL ILLNESS POLYNEUROPATHY:  She remains extremely weak.     After being transferred to the progressive pulmonary unit, she will be     treated with occupational and physical therapy to increase her strength.  4. HOSPITAL-ACQUIRED ESCHERICHIA COLI:  Is on day six of seven of treatment     with Maxipime.  She will receive one more day of Maxipime and it will be     discontinued.  She is on a short course of antimicrobial therapy.  5. TOXIC METABOLIC ENCEPHALOPATHY AND AGITATION:  This has been addressed    with Xanax and Duragesic patch while in the acute setting.  This will be      re-evaluated with a questionable wean from Xanax and the initiation of     benzodiazepines.  Again, this will be addressed once she is in the     progressive pulmonary unit.   DISCHARGE MEDICATIONS:  1. Aspirin 325 mg daily.  2. Prevacid q.12h. via tube.  3. Ferrous sulfate 300 mg t.i.d.  4.  Lovenox 40 mg daily.  5. Zocor 20 mg daily.  6. Xopenex 1.25 mg q.8h. via nebulizer.  7. Remeron 30 mg q.h.s.  8. Duragesic patch q.72h.  9. Xanax 0.25 mg q.6h.  10.      Insulin sliding scale:  Less than 150, no coverage; 150-199, 6     units; 200-299, 12 units; 300-399, 14 units; greater than 400, 16 units.  11.      Prednisone 20 mg a day.  12.      NPH insulin q.12h. 20 units.  13.      Lopressor 37.5 mg q.8h.  14.      She is on Aricept 100 mcg every 7 days.  15.      Zinc oxide to topical application.  16.      Maxipime 1 g IV q.12h. on day 6 for one more day, which will be     discontinued after the dose on June 09, 2003.  17.      She is on tube feeding, Jevity 1.2 calories, at 50 mL/hr.  18.      She has Senokot S tablets q.h.s. p.r.n.  19.      Dulcolax suppository p.r.n.  20.      Haldol 2 mg IV q.2-4h. p.r.n.  21.      Lorazepam 1-4 mg q.4-6h. p.r.n. agitation.  22.      She will also have fentanyl 25-75 mcg IV q.4h. p.r.n.   Her diet as noted.  She is being transferred to the progressive pulmonary  unit, to be followed up and hopefully be liberated from mechanical  ventilatory support.      Brett Canales Minor, A.C.N.P. [facility]                 Shan Levans, M.D. [facility]    SM/MEDQ  D:  06/08/2003  T:  06/08/2003  Job:  [id number]   cc:   Dr. Delaine Lame, [facility] ED   [place] Bing, M.D.   Arturo Morton. Riley Kill, M.D.   Wilhemina Bonito. Marina Goodell, M.D. [facility]   Jefry H. Pollyann Kennedy, M.D.  [address] [place]  [place] [address]  Fax: [phone]

## 2011-02-28 NOTE — Discharge Summary (Signed)
NAME:  [name], [name]                         ACCOUNT NO.:  [id number]   MEDICAL RECORD NO.:  [id number]                   PATIENT TYPE:  IPS   LOCATION:  4036                                 FACILITY:  [facility]   PHYSICIAN:  [name], M.D.           DATE OF BIRTH:  1940-10-14   DATE OF ADMISSION:  06/23/2003  DATE OF DISCHARGE:  06/30/2003                                 DISCHARGE SUMMARY   DISCHARGE DIAGNOSES:  1. Deconditioning, multiple medical.  2. Acute inferior myocardial infarction.  3. Acute respiratory distress syndrome and tracheotomy, decannulated     June 21, 2003.  4. Decreased nutritional stores.  5. Metabolic encephalopathy.  6. Steroid-induced diabetes mellitus.  7. Escherichia coli urinary tract infection, resolved.  8. Tachycardia secondary to deconditioning.  9. Hyperlipidemia.  10.      Anemia.   HISTORY OF PRESENT ILLNESS:  [age]-year-old female, initially admitted  to April 11, 2003 to [facility] with chest pain, shortness of breath  and altered mental status.  She was intubated in the emergency department.  EKG with heart block and acute inferior myocardial infarction.  She was  transferred to [facility] for cardiac catheter per Dr. Arturo Morton.  [place], findings of acute inferior myocardial infarction complicated by  cardiogenic shock due to probable right ventricular infarction.  Temporary  pacer was placed and intra-aortic balloon pump.  Hospital course complicated  by mechanical ventilator dependence, tracheotomy, April 27, 2003.  Critical  illnesses of polyneuropathy, toxic metabolic encephalopathy, increased blood  sugars that were steroid induced.  She was treated for an E. coli urinary  tract infection.  She received a gastrostomy feeding tube, June 14, 2003, per Dr. Lina Sar, for nutritional storage.  Swallow study,  June 21, 2003; diet advanced to mechanical soft.  She required minimal  assist for  ambulation.  Latest chemistries unremarkable.  Admitted for a  comprehensive rehab program.   PAST MEDICAL HISTORY:  See discharge diagnoses.   PAST SURGICAL HISTORY:  1. Bladder tacking.  2. Tumor resection from her breast that was benign.  3. Cholecystectomy.   ALLERGIES:  Allergies are CODEINE, SULFA and PENICILLIN.   PRIMARY MEDICAL DOCTOR:  Her primary M.D. is Dr. Chestine Spore of [place].   SOCIAL HISTORY:  Lives with husband in [place], independent prior to admission  with one-level home, two steps to entry; husband and daughter to assist on  discharge.   MEDICATIONS PRIOR TO ADMISSION:  Family were to provide.   HOSPITAL COURSE:  Patient with progressive gains while in rehab services  with therapies initiated on a twice-daily (b.i.d.) basis.  The following  issues were followed during the patient's rehab course:  Pertaining to Ms.  Sensing's deconditioning, long hospital course complicated by multiple medical  issues.  She continued to participate fully in all areas of therapy,  strength grossly graded at 4 to 5-/5.  She had no chest pain  or shortness of  breath.  She had undergone aortic balloon pump per cardiology services, Dr.  Riley Kill.  Her tracheostomy site was healing nicely, as she had been  decannulated on June 21, 2003.  Diet advanced to mechanical soft.  She  would follow up with Dr. Lina Sar for removal of gastrostomy feeding tube  that was placed on June 14, 2003.  She was cleared by speech therapy,  felt to be plateaued on cognitive levels, family to provide supervision at  home.  Blood pressures remained controlled on Lopressor.  No  gastrointestinal complaints on Protonix.  She was weaned from her prednisone  therapy and followup with pulmonary services.  She was advised no driving,  no smoking, no alcohol.  She was ambulating throughout the rehab unit, with  family teaching provided.  She was supervision with a rolling walker.  She  was discharged to home with  [facility] cardiac rehab arranged.   Latest labs showed a hemoglobin of 11.3, hematocrit 34, sodium 141,  potassium 3.6, BUN 18, creatinine 0.5.   DISCHARGE MEDICATIONS:  1. Ecotrin 325 mg daily.  2. Klonopin 1 mg twice daily.  3. Lopressor 25 mg every 12 hours.  4. Remeron 30 mg at bedtime.  5. Protonix 40 mg daily.  6. Prednisone taper as advised.  7. Zocor 20 mg daily.  8. Multivitamin daily.  9. Albuterol inhaler as needed.   ACTIVITY:  Activity as tolerated.   DIET:  Diet as tolerated.   SPECIAL DISCHARGE INSTRUCTIONS:  No driving, no smoking, no alcohol.    FOLLOWUP:  She should follow up with Dr. Lina Sar for removal of PEG tube  in six weeks, [facility] cardiac rehab arranged.      Mariam Dollar, P.A.                     [name], M.D.    DA/MEDQ  D:  07/03/2003  T:  07/03/2003  Job:  [id number]   cc:   [name], M.D.  [address] [place]  [place] [address]  Fax: [phone]   Shan Levans, M.D. [facility]   Maisie Fus D. Riley Kill, M.D.

## 2011-02-28 NOTE — Cardiovascular Report (Signed)
[name], Carol NO.:  [id number]   MEDICAL RECORD NO.:  [id number]          PATIENT TYPE:  INP   LOCATION:  2852                         FACILITY:  [facility]   PHYSICIAN:  Jonelle Sidle, M.D. LHCDATE OF BIRTH:  1941/05/26   DATE OF PROCEDURE:  DATE OF DISCHARGE:                              CARDIAC CATHETERIZATION   PRIMARY CARE PHYSICIAN:  Donzetta Sprung, M.D.   CARDIOLOGIST:  Jonelle Sidle, M.D. [facility].   INDICATIONS:  [name] is a pleasant 70 year old woman with a history of  hypertension, dyslipidemia, and coronary artery disease, status post  inferior wall myocardial infarction, with right ventricular involvement and  cardiogenic shock back in June 2004.  At that time, she underwent emergent  intervention with stent placement to the proximal right coronary artery, and  subsequently had a prolonged hospital course, with ventilator-dependent  respiratory failure due to fibroproliferative adult respiratory distress  syndrome.  She made a significant improvement and has been followed  medically since that time.  She presented to the [facility] Emergency  Department today with a week-long history of intermittent chest and arm  discomfort.  Her electrocardiogram showed nonspecific ST-T wave changes,  with no ST elevation in the standard or right ventricular leads, and  initially her troponin I level was negative.  She did become relatively  hypotensive on nitroglycerin, with systolic blood pressure down into the  80s, and given her presentation was transferred urgently to the cardiac  catheterization lab at [facility] by Dr. Myrtis Ser.  She is now to  undergo urgent coronary angiography.   PROCEDURES PERFORMED:  1.  Left heart catheterization.  2.  Selective coronary angiography.  3.  Left ventriculography.   ACCESS AND EQUIPMENT:  The area about the right femoral artery was  anesthetized with 1% lidocaine, and a 6 French sheath was  placed in the  right femoral artery via the modified Seldinger technique.  Standard  preformed 6 [place] JL-4 and JR-4 catheters were used for selective coronary  angiography, and an angled pigtail catheter was used for left heart  catheterization and left ventriculography.  All exchanges were made over a  wire, and the patient tolerated the procedure well, without immediate  complications.   HEMODYNAMICS:  Left ventricle 97/60 mmHg.  Aorta 96/56 mmHg.   ANGIOGRAPHIC FINDINGS:  1.  The left main coronary artery is free of significant flow-limiting      coronary atherosclerosis.  2.  The left anterior descending is a medium caliber vessel with a very high      bifurcating diagonal branch and very small distal diagonal branches.      There is approximately 50% stenosis involving the high diagonal branch,      with 30% stenosis in the mid-left anterior descending, but no other flow-      limiting stenoses noted, and TIMI III flow throughout.  3.  The circumflex coronary artery is a medium caliber vessel as well, with      two small first and second obtuse marginal branches, followed by larger      third and fourth  obtuse marginal branches.  There is an area of stenosis      in the midvessel segment proximal to the third large obtuse marginal. It      looks to be in the 60% range, although somewhat worse in the straight      cranial view, although there does look to be vessel overlap and      potentially some foreshortening in this area.  There is somewhat of a      hazy appearance, although the flow is TIMI III in this system.  More      distally in the circumflex, there is a 20% stenosis noted as well.  4.  The right coronary artery has a stent in the proximal vessel segment      that is widely patent.  There is a 20% stenosis proximal to the stent      and otherwise TIMI III flow throughout.  5.  Left ventriculography was performed in the RAO projection, revealing an      ejection  fraction of approximately 70% in the setting of significant      ventricular ectopy.   DIAGNOSES:  1.  Mild to moderate coronary atherosclerosis, as outlined, with a patent      stent site in the proximal right coronary artery, approximately 60%      stenosis in the midcircumflex, 50% stenosis involving a high diagonal      branch, and 30% stenosis in the left anterior descending.  There is      somewhat of a hazy appearance to the midcircumflex stenosis, although      coronary flow is TIMI III, and this is not clearly a culprit lesion to      explain rest symptoms, particularly in the absence of abnormal troponin      I levels.  2.  Normal left ventricular ejection fraction in the 70% range, in the      setting of significant ventricular ectopy.   PLAN:  I reviewed the films with Dr. Antoine Poche.  At this point, it is  possible that there was a transient plaque rupture event in the  midcircumflex, although cardiac markers are negative at this time.  The  residual stenosis does not appear to be critical enough to be causing rest  symptoms at present.  Plan will be to admit the patient for careful  observation, discontinue Integrilin, begin heparin in six hours after sheath  removal, and follow up serial cardiac markers.  A D-dimer level will also be  obtained and, if abnormal plan to proceed with a CT scan of the chest.  If  cardiac markers trend abnormally, this provides more merit for the  possibility of a transient plaque rupture event.  At that point, it might be  reasonable to consider a Cardiolite to look for residual ischemia in the  circumflex distribution that might warrant revascularization.  The patient's  relative hypotension, particularly on nitroglycerin, will at this point be  managed with fluids and careful observation pending other etiologies.  Will  also plan to follow up with an echocardiogram.  Further plans to follow.      SGM/MEDQ  D:  08/27/2004  T:  08/28/2004   Job:  [id number]

## 2011-02-28 NOTE — Op Note (Signed)
   NAME:  [name], [name]                         ACCOUNT NO.:  [id number]   MEDICAL RECORD NO.:  [id number]                   PATIENT TYPE:  INP   LOCATION:  2922                                 FACILITY:  [facility]   PHYSICIAN:  Oley Balm. Sung Amabile, M.D. [facility]          DATE OF BIRTH:  [birth year]   DATE OF PROCEDURE:  05/02/2003  DATE OF DISCHARGE:                                 OPERATIVE REPORT   PROCEDURE PERFORMED:  Bronchoscopy.   INDICATIONS FOR PROCEDURE:  Mucous plugging.   PREMEDICATION:  The patient is already on a Versed drip and also received 4  mg of Versed as a push and 100 mcg of fentanyl.  Due to respiratory  dyssynchrony, she also received 6 mg of Pavulon during the course of the  procedure.   DESCRIPTION OF PROCEDURE:  After adequate sedation, lidocaine was instilled  in the airway via the tracheostomy tube.  Subsequent to this, the  bronchoscope was introduced via the tracheostomy tube and a cursory airway  examination was performed.  This demonstrated changes of moderate to severe  bronchitis.  There were no endobronchial tumors, masses or foreign bodies.  After the airway examination was performed, the bronchoscope was directed  into both lower lobes and aggressive washings with normal saline and  Mucomyst was undertaken.  This required several passes of the bronchoscope  as the patient desaturated very readily during the course of the procedure.  Copious mucous plugs were removed from both lower lobes.  Except for  transient hypoxemia the patient tolerated the procedure well. No specimens  were sent.                                               Oley Balm Sung Amabile, M.D. [facility]    DBS/MEDQ  D:  05/02/2003  T:  05/02/2003  Job:  [id number]

## 2011-02-28 NOTE — Op Note (Signed)
   NAME:  [name], [name]                         ACCOUNT NO.:  [id number]   MEDICAL RECORD NO.:  [id number]                   PATIENT TYPE:  INP   LOCATION:  2922                                 FACILITY:  [facility]   PHYSICIAN:  Jefry H. Pollyann Kennedy, M.D.                DATE OF BIRTH:  1941-09-24   DATE OF PROCEDURE:  04/27/2003  DATE OF DISCHARGE:                                 OPERATIVE REPORT   PREOPERATIVE DIAGNOSIS:  Ventilator dependent, status post myocardial  infarction.   POSTOPERATIVE DIAGNOSIS:  Ventilator dependent,  status post  myocardial  infarction.   PROCEDURE:  Tracheostomy.   SURGEON:  Jefry H. Pollyann Kennedy, M.D.   ANESTHESIA:  General endotracheal anesthesia.   COMPLICATIONS:  None.   ESTIMATED BLOOD LOSS:  5 mL.   HISTORY OF PRESENT ILLNESS:  This is a 70 year old lady who was admitted to  the hospital  about 2 weeks prior with an acute myocardial infarction. She  has been on the ventilator  since then. The decision was made that she would  require long-term ventilatory support. A tracheostomy was decided to be  appropriate. The  risks, benefits and alternatives and complications of the  procedure were explained to the family members who seemed to understand and  agreed to the surgery.   DESCRIPTION OF PROCEDURE:  The patient was brought to the operating room and  placed on the operating table in the supine position. Following  induction  of general endotracheal anesthesia the neck  was prepped and draped in the  standard fashion.   A vertical incision was created at the midline  using electrocautery.  Electrocautery was used to dissect through the superficial fascia and the  space of the strap muscles. The trachea was exposed with the thyroid isthmus  superior  to the level of dissection. A tracheostomy was created between the  2nd and 3rd ring and a lower tracheal ring flap was created as well and  sutured to the cervical skin with a chromic suture.   Under  direct vision the endotracheal tube was removed from the mouth and a  #6 Shiley cuffed tracheostomy tube was placed without difficulty. Secretions  were aspirated. The trach shield was secured to the cervical skin  using  nylon suture and Velcro trach ties.  The patient was then transferred back to the intensive care unit in stable  condition.                                               Jefry H. Pollyann Kennedy, M.D.    JHR/MEDQ  D:  04/28/2003  T:  04/29/2003  Job:  [id number]

## 2011-02-28 NOTE — Discharge Summary (Signed)
NAME:  [name], [name]                         ACCOUNT NO.:  [id number]   MEDICAL RECORD NO.:  [id number]                   PATIENT TYPE:  INP   LOCATION:  0160                                 [facility]ACILITY[facility]  [facility]   PHYSICIAN:  Charlaine Dalton. Sherene Sires, M.D. [facility]           DATE OF BIRTH:  08/31/1941   DATE OF ADMISSION:  06/08/2003  DATE OF DISCHARGE:  06/23/2003                           DISCHARGE SUMMARY - REFERRING   DISCHARGE DIAGNOSES:  1. Prolonged ventilator-dependent respiratory failure secondary to acute     myocardial infarction with right ventricular infarct.  2. Fibroprolific acute respiratory distress syndrome.  3. Ventilator-required pneumonia.  4. Severe debility secondary to prolonged hospitalization.  5. Anemia.   HISTORY OF PRESENT ILLNESS:  [name] is a 70 year old white female who  presented to [facility] on April 11, 2003, with a 24-hour history of  chest pain.  She was found to be cardiovascularly hemodynamically unstable.  She required emergency intubation in the emergency department and was  treated with vasopressors for bradycardia and hypotension.  She was acutely  transferred to [facility] via [facility]  transport service and was unstable en route.  She required emergency cardiac  catheterization with a stent of the RCA and found to be severely hypotensive  and in cardiogenic shock and required intra-aortic balloon pump along with  vasopressor support.  She was admitted to the cardiac intensive care unit  and has remained ventilator-dependent until she was transferred to the  progressive pulmonary unit on June 08, 2003.  She was transferred to the  progressive pulmonary unit for long-term weaning secondary to ventilator-  dependent respiratory failure with underlying acute MI and fibroprolific  acute respiratory distress syndrome.   LABORATORY DATA:  Lower extremity Doppler studies were negative.   ________ Free is 2.2, T3  is 34.1, T4 is 5.2.  Arterial blood gas on 40% T-  bar revealed pH 7.43, pCO2 of 43, pO2 of 272, with bicarbonate of 28.  WBC  is 18.8, hemoglobin 9.4, hematocrit 28.1, platelets are 402.  Note,  hemoglobin reached a low of 7.8.  Reticulocytes 3.8, rbc's 2.9,  reticulocytes absolute 110.  INR 0.9, PTT of 31.  Sodium 144, potassium 3.5,  chloride is 105, CO2 is 33, glucose 91, BUN is 13, creatinine 0.4, calcium  is 9.1.  Note the glucose reached a peak of 380.  B-type natriuretic peptide  ranged from 67.5-99.2.  TSH is low at 2.82.  Iron was 25, total iron-binding  capacity is 200, saturations were 13.  RBC folate is 893.  Ferritin is 844.  B12 is 1610.  Prealbumin is 15.  Fibrinogen D-dimer is 0.8.   Radiographic data:  Chest x-ray on June 21, 2003, shows improving  bilateral interstitial and air-space disease.   PROCEDURES:  1. Tracheostomy, #8 Shiley inserted July 16 to September 7, with change to     #4 metal trach on September  7, with decannulation on June 22, 2003.  2. PEG placed on June 13, 2003, which remains in place.  3. Right percutaneous indwelling central catheter right cephalic vein placed     on July 27, removed on June 23, 2003.   HOSPITAL COURSE BY DISCHARGE DIAGNOSES:  #1 - PROBLEM WITH VENTILATOR-  DEPENDENT RESPIRATORY FAILURE SECONDARY TO ACUTE MYOCARDIAL INFARCTION WITH  RIGHT VENTRICULAR INFARCT, STATUS POST ACUTE MYOCARDIAL INFARCTION ALONG  WITH FIBROPROLIFIC ACUTE RESPIRATORY DISTRESS SYNDROME:  Ms. [name]  was transferred on June 08, 2003, to [facility]  Unit for liberation from the ventilator.  Tracheostomy had been in place  since April 28, 2003, was changed to a #4 metal on June 20, 2003, and she  was successfully decannulated on June 22, 2003.  She was weaned from the  ventilator by June 16, 2003.  Currently, she is able to ambulate about  with assistance.  She was still requiring low-dose oxygen but  saturations  remaining in the 95-96% range, and she may be able to be weaned from O2 in  the near future.  Again, she has been decannulated.  She has been liberated  from the ventilator for the last seven days.  Therefore, she is being  transferred to rehabilitation services secondary to severe debility.  #2 - STATUS POST ACUTE MYOCARDIAL INFARCTION WITH RIGHT VENTRICULAR INFARCT:  She remains treated medically and will be followed by cardiology on an  ongoing basis.  #3 - SEVERE DEBILITY SECONDARY TO PROLONGED HOSPITALIZATION:  She is now  able to ambulate with assistance and is making progress in this area.  Again, she is being transferred to [facility] for  further evaluation and treatment.  #4 - ANEMIA:  Note her hemoglobin reached a low of 7.8.  She has remained on  Aricept while in the hospital along with supplemental iron.   DISCHARGE MEDICATIONS:  1. Remeron 30 mg q.h.s.  2. Zinc oxide to affected area q.6h.  3. Epoietin 100 mcg subcutaneous every seven days.  4. Clonazepam 1 mg b.i.d. p.o.  5. Xopenex dispense as written, 0.63 q.6h.  6. Protonix 40 mg p.o. daily.  7. Aspirin 325 mg buffered once a day.  8. Ciprofloxacin 750 mg q.12h. for remaining six days, then discontinue.  9. Ferrous sulfate 325 mg three times daily.  10.      Guaifenesin 20 mL q.6h.  11.      Lopressor 12.5 mg q.6h. p.o.  12.      Prednisone 10 mg b.i.d.  13.      Seroquel 100 mg q.h.s.  14.      Seroquel 50 mg daily.  15.      Zocor 20 mg daily.  16.      Free water via PEG 15 mL three times daily.  17.      Dulcolax suppository 10 mg daily p.r.n.  18.      Tylenol 650 mg q.4h. p.r.n.  19.      Senokot-S 1 tablet q.h.s. p.r.n.  20.      Ambien dispense as written, 5-10 mg q.h.s. p.r.n. for sleep.   DIET:  Mechanical soft, advance as tolerated.   SPECIAL INSTRUCTIONS:  She has O2 at 2 L nasal cannula, to titrate off for  saturations greater than  94-95%.   DISPOSITION/CONDITION ON DISCHARGE:  She has been successfully liberated  from the ventilator.  She has been off ventilator support for seven days.  The tracheostomy has  been removed.  She is decannulated.  She is being  discharged in improved condition.     Brett Canales Minor, A.C.N.P. [facility]                 Charlaine Dalton. Sherene Sires, M.D. [facility]    SM/MEDQ  D:  06/23/2003  T:  06/23/2003  Job:  [id number]   cc:   [place] Bing, M.D.   Arturo Morton. Riley Kill, M.D.   Donzetta Sprung  [address]  [place]  [place] [address]  Fax: [phone]   Jonelle Sidle, M.D. [facility]

## 2011-07-30 LAB — POCT I-STAT 3, ART BLOOD GAS (G3+)
O2 Saturation: 90
Operator id: 103831
pCO2 arterial: 37.9
pO2, Arterial: 60 — ABNORMAL LOW

## 2011-07-30 LAB — POCT I-STAT 3, VENOUS BLOOD GAS (G3P V)
Acid-base deficit: 1
Acid-base deficit: 6 — ABNORMAL HIGH
O2 Saturation: 56
Operator id: 103831
TCO2: 21
pCO2, Ven: 39.9 — ABNORMAL LOW
pH, Ven: 7.371 — ABNORMAL HIGH

## 2014-09-22 ENCOUNTER — Encounter: Payer: Self-pay | Admitting: Pulmonary Disease

## 2014-09-25 ENCOUNTER — Telehealth: Payer: Self-pay | Admitting: Pulmonary Disease

## 2014-09-25 ENCOUNTER — Encounter: Payer: Self-pay | Admitting: Pulmonary Disease

## 2014-09-25 ENCOUNTER — Ambulatory Visit (INDEPENDENT_AMBULATORY_CARE_PROVIDER_SITE_OTHER): Payer: Commercial Managed Care - HMO | Admitting: Pulmonary Disease

## 2014-09-25 VITALS — BP 142/76 | HR 77 | Temp 97.0°F | Ht 63.0 in

## 2014-09-25 DIAGNOSIS — J8410 Pulmonary fibrosis, unspecified: Secondary | ICD-10-CM

## 2014-09-25 NOTE — Patient Instructions (Signed)
Will schedule for breathing studies, and see you back on the same day to review. [facility] that you have been on for your urinary issues.

## 2014-09-25 NOTE — Progress Notes (Signed)
Subjective:    Patient ID: [name], female    DOB: Jun 02, 1941, 73 y.o.   MRN: [id number]  HPI The patient is a 73 year old female who I have been asked to see for an abnormal chest x-ray and CT. She has been seen by our practice in the distant past, with the last being in 2009. She has a history of fiber proliferative ARDS in 2004, and her interstitial changes in 2009 were felt secondary to this or possibly chronic reflux with aspiration. She had PFTs in 2009 that showed no airflow obstruction, but she did have restriction with a DLCO abnormality. She has not been seen since that time, but recently had a chest x-ray that showed progressive fibrotic changes. This was followed by a CT chest which showed prominent interstitial disease, left much greater than right, and was subpleural in nature. There was also a large bleb in the left upper lobe, and significant volume loss on the left. There was underlying bronchiectasis and honeycombing. The patient has chronic dyspnea on exertion, but thinks her symptoms have worsened over the last one year. She was walking 3 times a week approximately 2 miles, but stopped this summer. She will get winded walking up one flight of stairs and making her bed, but does not get short of breath bringing groceries in from the car. She is on a bronchodilator regimen, but does not have documented COPD by PFTs. She does have a history of smoking one pack per day for 30 years, but has not smoked since 2004. The patient denies reflux or aspiration symptoms at this time. She has worked in a plant in the past doing inspections, but this was a very clean environment. She denies any symptoms consistent with autoimmune disease, and has no family history except for a father with "crippling arthritis". She does have a mild cough that occurs primarily at night upon lying down, but it is dry in nature. She does not feel that she has a cough problems during the day. It should be noted that  she has a history of chronic urinary tract infections, and is currently on Macrodantin. [facility], and has been on frequent antibiotics for this. She is unclear if she has been on Macrodantin a lot over the years.   Review of Systems  Constitutional: Negative for fever and unexpected weight change.  HENT: Negative for congestion, dental problem, ear pain, nosebleeds, postnasal drip, rhinorrhea, sinus pressure, sneezing, sore throat and trouble swallowing.   Eyes: Negative for redness and itching.  Respiratory: Positive for cough and shortness of breath. Negative for chest tightness and wheezing.   Cardiovascular: Negative for palpitations and leg swelling.  Gastrointestinal: Negative for nausea and vomiting.  Genitourinary: Negative for dysuria.  Musculoskeletal: Negative for joint swelling.  Skin: Negative for rash.  Neurological: Negative for headaches.  Hematological: Does not bruise/bleed easily.  Psychiatric/Behavioral: Negative for dysphoric mood. The patient is not nervous/anxious.        Objective:   Physical Exam Constitutional:  Well developed, no acute distress  HENT:  Nares patent without discharge  Oropharynx without exudate, palate and uvula are normal  Eyes:  Perrla, eomi, no scleral icterus  Neck:  No JVD, no TMG  Cardiovascular:  Normal rate, regular rhythm, no rubs or gallops.  No murmurs        Intact distal pulses  Pulmonary :  Normal breath sounds, no stridor or respiratory distress   No rhonchi, or  wheezing.  Prominent crackles bilat, 1/3 up, L >> R  Abdominal:  Soft, nondistended, bowel sounds present.  No tenderness noted.   Musculoskeletal:  minimal lower extremity edema noted.  Lymph Nodes:  No cervical lymphadenopathy noted  Skin:  No cyanosis noted  Neurologic:  Alert, appropriate, moves all 4 extremities without obvious deficit.         Assessment & Plan:

## 2014-09-25 NOTE — Telephone Encounter (Signed)
Dr Garner Nashdaniels office calling back a/b pt antibiotics and says that she was on macrodantin for 10 days in may 2014 and since then on leviqulin and dox l course  Each.Caren GriffinsStanley A Dalton

## 2014-09-25 NOTE — Telephone Encounter (Signed)
lmtcb for nurse.  

## 2014-09-25 NOTE — Telephone Encounter (Signed)
Danella DeisShay called back and reports pt has only been on macrodatin for 10 days in 2014 and since 1 course of doxy and levaquin. KC is aware.  [facility] Pt on nitrofuraton the following dates: 01/23/12 #14 03/10/12 #14 06/10/12 #14 10/15/12 #14 01/17/13 #14 09/26/13 #14 05/29/14 #14 08/14/14 #74 i po BID x 7 days then QD x 60 days  Pt has been on cipro 08/19/12, 12/04/11, 05/19/14

## 2014-09-25 NOTE — Telephone Encounter (Signed)
[name], please call [name]'s office and see if they can send [name] a med list with the antibiotics she has taken over the years for her UTI.  How much has she been on macrodantin (nitrofurantoin)??

## 2014-09-25 NOTE — Assessment & Plan Note (Signed)
The patient has fibrotic changes on her chest x-ray and CT chest that appear progressive from 2009. These were initially felt to be secondary to her episode of fiber proliferative ARDS in 2004, but it appears the changes are progressive. The question has been raised about chronic aspiration related to laryngopharyngeal pharyngeal reflux, and the patient also has been on prolonged antibiotics in the past which include Macrodantin. This is known to cause pulmonary fibrosis in a percentage of the population. The other possibility is that she may have a progressive fibrotic lung disease such as IPF, or autoimmune related disease. She does not have any significant history of exposures. At this point, I would like to repeat her pulmonary function studies, and also get her [facility] to see how much Macrodantin she has been on in the past. More than likely, I will need to check an autoimmune panel at her next visit for completeness. The CT findings are a little atypical for IPF, especially with one lung being so much worse than the other, and the volume loss that is unilateral in the left hemithorax.

## 2014-09-25 NOTE — Telephone Encounter (Signed)
Left a message for nurse to call me back

## 2014-10-19 DIAGNOSIS — R319 Hematuria, unspecified: Secondary | ICD-10-CM | POA: Diagnosis not present

## 2014-10-19 DIAGNOSIS — Z9889 Other specified postprocedural states: Secondary | ICD-10-CM | POA: Diagnosis not present

## 2014-10-19 DIAGNOSIS — R634 Abnormal weight loss: Secondary | ICD-10-CM | POA: Diagnosis not present

## 2014-10-19 DIAGNOSIS — N210 Calculus in bladder: Secondary | ICD-10-CM | POA: Diagnosis not present

## 2014-10-19 DIAGNOSIS — R110 Nausea: Secondary | ICD-10-CM | POA: Diagnosis not present

## 2014-10-19 DIAGNOSIS — K760 Fatty (change of) liver, not elsewhere classified: Secondary | ICD-10-CM | POA: Diagnosis not present

## 2014-10-19 DIAGNOSIS — N281 Cyst of kidney, acquired: Secondary | ICD-10-CM | POA: Diagnosis not present

## 2014-10-19 DIAGNOSIS — R935 Abnormal findings on diagnostic imaging of other abdominal regions, including retroperitoneum: Secondary | ICD-10-CM | POA: Diagnosis not present

## 2014-10-19 DIAGNOSIS — R140 Abdominal distension (gaseous): Secondary | ICD-10-CM | POA: Diagnosis not present

## 2014-10-19 DIAGNOSIS — Z9049 Acquired absence of other specified parts of digestive tract: Secondary | ICD-10-CM | POA: Diagnosis not present

## 2014-10-19 DIAGNOSIS — Z90710 Acquired absence of both cervix and uterus: Secondary | ICD-10-CM | POA: Diagnosis not present

## 2014-10-25 ENCOUNTER — Telehealth: Payer: Self-pay | Admitting: Pulmonary Disease

## 2014-10-25 ENCOUNTER — Ambulatory Visit (INDEPENDENT_AMBULATORY_CARE_PROVIDER_SITE_OTHER): Payer: Commercial Managed Care - HMO | Admitting: Pulmonary Disease

## 2014-10-25 ENCOUNTER — Other Ambulatory Visit (INDEPENDENT_AMBULATORY_CARE_PROVIDER_SITE_OTHER): Payer: Commercial Managed Care - HMO

## 2014-10-25 ENCOUNTER — Encounter: Payer: Self-pay | Admitting: Pulmonary Disease

## 2014-10-25 VITALS — BP 134/74 | HR 71 | Temp 98.0°F | Ht 63.0 in | Wt 136.0 lb

## 2014-10-25 DIAGNOSIS — J8410 Pulmonary fibrosis, unspecified: Secondary | ICD-10-CM | POA: Diagnosis not present

## 2014-10-25 LAB — PULMONARY FUNCTION TEST
DL/VA % PRED: 70 %
DL/VA: 3.31 ml/min/mmHg/L
DLCO unc % pred: 34 %
DLCO unc: 7.9 ml/min/mmHg
FEF 25-75 Post: 1.7 L/sec
FEF 25-75 Pre: 1.27 L/sec
FEF2575-%Change-Post: 34 %
FEF2575-%PRED-POST: 100 %
FEF2575-%Pred-Pre: 75 %
FEV1-%Change-Post: 5 %
FEV1-%Pred-Post: 61 %
FEV1-%Pred-Pre: 57 %
FEV1-PRE: 1.19 L
FEV1-Post: 1.26 L
FEV1FVC-%Change-Post: 4 %
FEV1FVC-%PRED-PRE: 109 %
FEV6-%Change-Post: 1 %
FEV6-%Pred-Post: 56 %
FEV6-%Pred-Pre: 55 %
FEV6-Post: 1.47 L
FEV6-Pre: 1.45 L
FEV6FVC-%CHANGE-POST: 0 %
FEV6FVC-%PRED-POST: 104 %
FEV6FVC-%PRED-PRE: 105 %
FVC-%CHANGE-POST: 1 %
FVC-%PRED-PRE: 52 %
FVC-%Pred-Post: 53 %
FVC-Post: 1.48 L
FVC-Pre: 1.45 L
Post FEV1/FVC ratio: 85 %
Post FEV6/FVC ratio: 99 %
Pre FEV1/FVC ratio: 82 %
Pre FEV6/FVC Ratio: 100 %
RV % pred: 46 %
RV: 1.02 L
TLC % pred: 50 %
TLC: 2.47 L

## 2014-10-25 LAB — SEDIMENTATION RATE: Sed Rate: 15 mm/hr (ref 0–22)

## 2014-10-25 LAB — RHEUMATOID FACTOR: Rhuematoid fact SerPl-aCnc: <10 IU/mL (ref <=14)

## 2014-10-25 NOTE — Assessment & Plan Note (Signed)
The patient has pulmonary fibrosis on her CT chest that is not an atypical pattern for usual interstitial pneumonitis or other known interstitial lung disease. Her chest x-ray and CT are clearly progressive, and her PFTs do show a significant decline in total lung capacity and diffusion capacity from 2009. It is unclear if this is related to an autoimmune process, or whether she may be having ongoing silent aspiration which is contributing to this. I would like to send an autoimmune panel to check for this, and she also needs to be scheduled for a barium swallow to look at esophageal motility and to evaluate for silent reflux with aspiration. The patient tells me that she has multiple medical test scheduled in the upcoming weeks, and will have to call me when she is able to get this scheduled. If her autoimmune workup and barium swallow are normal, we may have to consider a thoracoscopic biopsy for diagnosis. She has significant desaturation with activity today, and we'll therefore need to start her on oxygen with exertion. Will also check overnight oximetry to make sure she does not need during sleep.

## 2014-10-25 NOTE — Patient Instructions (Addendum)
Stop symbicort and albuterol  Will check bloodwork to evaluate for some type of autoimmune disease. You need to have a barium swallow to evaluate for reflux and aspiration.  Please call us when you are able to do this, and we can schedule.  Will call you with the results of your labwork.  Will start on oxygen with exertion, and also check your oxygen level overnight while sleeping

## 2014-10-25 NOTE — Progress Notes (Signed)
   Subjective:    Patient ID: [name], female    DOB: August 12, 1941, 74 y.o.   MRN: [id number]  HPI Patient comes in today for follow-up of her known interstitial lung disease of unknown origin. She has had pulmonary function studies which show a progressive decline in her total lung capacity and diffusion capacity from 2009. She has no airflow obstruction, and therefore does not have COPD. I've asked her to discontinue her inhaled medications. I have reviewed her chest x-ray findings and her PFTs with her and her husband again, and answered all of their questions.  Of note, I was [facility], and the patient has been on Macrodantin on and off for her chronic UTIs, but never for a prolonged period of time.   Review of Systems  Constitutional: Negative for fever and unexpected weight change.  HENT: Negative for congestion, dental problem, ear pain, nosebleeds, postnasal drip, rhinorrhea, sinus pressure, sneezing, sore throat and trouble swallowing.   Eyes: Negative for redness and itching.  Respiratory: Positive for cough and shortness of breath. Negative for chest tightness and wheezing.   Cardiovascular: Negative for palpitations and leg swelling.  Gastrointestinal: Positive for vomiting. Negative for nausea.  Genitourinary: Negative for dysuria.  Musculoskeletal: Negative for joint swelling.  Skin: Negative for rash.  Neurological: Negative for headaches.  Hematological: Does not bruise/bleed easily.  Psychiatric/Behavioral: Negative for dysphoric mood. The patient is not nervous/anxious.        Objective:   Physical Exam Overweight female in no acute distress Nose without purulence or discharge noted Neck without lymphadenopathy or thyromegaly Chest with crackles, no wheezes Cardiac exam with regular rate and rhythm Lower extremities with mild edema, no cyanosis Alert and oriented, moves all 4 extremities.       Assessment & Plan:

## 2014-10-25 NOTE — Telephone Encounter (Signed)
Called [facility] # for carol rang numerous times and no answer. WCB

## 2014-10-25 NOTE — Progress Notes (Signed)
PFT done today. 

## 2014-10-26 LAB — SJOGRENS SYNDROME-B EXTRACTABLE NUCLEAR ANTIBODY: SSB (La) (ENA) Antibody, IgG: <1

## 2014-10-26 LAB — CYCLIC CITRUL PEPTIDE ANTIBODY, IGG

## 2014-10-26 LAB — ANGIOTENSIN CONVERTING ENZYME: ANGIOTENSIN-CONVERTING ENZYME: 35 U/L (ref 8–52)

## 2014-10-26 LAB — SJOGRENS SYNDROME-A EXTRACTABLE NUCLEAR ANTIBODY: SSA (RO) (ENA) ANTIBODY, IGG: NEGATIVE

## 2014-10-26 LAB — ANTI-SCLERODERMA ANTIBODY: SCLERODERMA (SCL-70) (ENA) ANTIBODY, IGG: NEGATIVE

## 2014-10-26 LAB — ANA: ANA: NEGATIVE

## 2014-10-26 LAB — ANTI-DNA ANTIBODY, DOUBLE-STRANDED: ds DNA Ab: <1 IU/mL

## 2014-10-26 NOTE — Telephone Encounter (Signed)
Spoke with [place].  They need to know what liter flow pt will need to be on for the POC and also they currently have about a 2 month waiting list for the POC's.  Does Dr Shelle Ironlance want to have small portable tanks until then?  Please advise

## 2014-10-27 DIAGNOSIS — J8410 Pulmonary fibrosis, unspecified: Secondary | ICD-10-CM | POA: Diagnosis not present

## 2014-10-27 NOTE — Telephone Encounter (Signed)
Spoke with Carol-ONO results are sitting at independent office that does ONO for them and the order that was sent was not signed by [facility]; will need PCC to refax with signature on it today. As well as POC is aobut 6 weeks for patietn but if patient needs O2 at night as well then [facility] can give concentrator at home for QHS use and give small tanks during the wait time for POC. Patient has already been informed of this prior to this call by Okey Regalarol at [facility].   ONO results will be faxed today once they receive signed order from 10-25-14 and we can have Doc of Day give liter flow needed for patient as KC is out of office today and wants pt to have tanks ASAP.

## 2014-10-27 NOTE — Telephone Encounter (Signed)
Okey Regalarol from [facility] calling to check on signed order from [facility], states they can not release download results until they have signed order for overnight test. I advised her that [facility] will not be in the office until Monday 10/30/14. Please return call at [phone], if anything further is needed.

## 2014-10-27 NOTE — Telephone Encounter (Signed)
Order printed and refaxed to [facility] Tobe SosSally E Ottinger

## 2014-10-28 LAB — ALDOLASE: Aldolase: 5.3 U/L (ref <=8.1)

## 2014-11-06 DIAGNOSIS — Z1231 Encounter for screening mammogram for malignant neoplasm of breast: Secondary | ICD-10-CM | POA: Diagnosis not present

## 2014-11-06 DIAGNOSIS — R351 Nocturia: Secondary | ICD-10-CM | POA: Diagnosis not present

## 2014-11-06 DIAGNOSIS — N210 Calculus in bladder: Secondary | ICD-10-CM | POA: Diagnosis not present

## 2014-11-06 DIAGNOSIS — R3915 Urgency of urination: Secondary | ICD-10-CM | POA: Diagnosis not present

## 2014-11-10 ENCOUNTER — Telehealth: Payer: Self-pay | Admitting: Pulmonary Disease

## 2014-11-10 DIAGNOSIS — J8410 Pulmonary fibrosis, unspecified: Secondary | ICD-10-CM

## 2014-11-10 NOTE — Telephone Encounter (Signed)
Spoke with Okey Regalarol at [facility], states that the patient is still not on 02.  States pt had ono, pt only wants poc, there was a backorder on poc's so she was going to get a small tank.  Okey RegalCarol states that all [facility] has is nocturnal 02 results.  Also states that an order needs to be signed by [facility] before any 02 can be delivered to patient.  This was faxed to Katie's attn a couple of weeks ago.  Asked Okey RegalCarol to refax this to Mindy's attn so it will go directly to [facility].    Forwarding to Mindy to make her aware of form to be signed and faxed.

## 2014-11-13 DIAGNOSIS — R112 Nausea with vomiting, unspecified: Secondary | ICD-10-CM | POA: Diagnosis not present

## 2014-11-13 DIAGNOSIS — R634 Abnormal weight loss: Secondary | ICD-10-CM | POA: Diagnosis not present

## 2014-11-13 NOTE — Telephone Encounter (Signed)
Pt fell to sat 74% during night on ONO. Needs oxygen at 2 liters qhs thru her DME.  Thanks.

## 2014-11-13 NOTE — Telephone Encounter (Signed)
Please see order 10/25/2014.  Oxygen with ambulation was ordered, and also ONO.  I have not seen the ONO.  They need to get her the oxygen with exertion as ordered on 1/13, and I need the ONO results.

## 2014-11-13 NOTE — Telephone Encounter (Signed)
Fax received and will place in [facility] green folder. Thanks

## 2014-11-13 NOTE — Telephone Encounter (Signed)
The ONO is in KC's green folder. Please advise thanks

## 2014-11-13 NOTE — Telephone Encounter (Signed)
Called [facility] and now closed. WCB in AM Per KC send pt to diff DME since [facility] does not want to set up O2 until form is signed. Please advise PCC's thanks

## 2014-11-14 ENCOUNTER — Telehealth: Payer: Self-pay | Admitting: Pulmonary Disease

## 2014-11-14 DIAGNOSIS — J8410 Pulmonary fibrosis, unspecified: Secondary | ICD-10-CM

## 2014-11-14 NOTE — Telephone Encounter (Signed)
Pt aware that order placed for POC evaluation. Nothing further needed.

## 2014-11-14 NOTE — Telephone Encounter (Signed)
I cannot give a liter flow until she is assessed to see what liter flow she needs!! Ok to send order :  Please evaluate for POC asap.   Please let pt know that [facility] is not the most service oriented company, but apparently her insurance only uses them.

## 2014-11-14 NOTE — Telephone Encounter (Signed)
Pt has [employer] & [facility] is the only DME contracted with them Lucilla Edinawne J Law

## 2014-11-14 NOTE — Telephone Encounter (Signed)
Due to pt's ins, we are unable to switch her DME [name]

## 2014-11-14 NOTE — Telephone Encounter (Signed)
So there is no other DME that has contracts with her insurance?

## 2014-11-14 NOTE — Telephone Encounter (Signed)
I spoke with [name] at [facility] and if Dr Shelle Ironlance still wants pt to be on oxygen with exertion they need a liter flow order and also need order returned to RT eval for POC.  Per last ov:  Stop symbicort and albuterol   Will check bloodwork to evaluate for some type of autoimmune disease. You need to have a barium swallow to evaluate for reflux and aspiration.  Please call us when you are able to do this, and we can schedule.   Will call you with the results of your labwork.   Will start on oxygen with exertion, and also check your oxygen level overnight while sleeping  Please advise

## 2014-11-17 DIAGNOSIS — R42 Dizziness and giddiness: Secondary | ICD-10-CM | POA: Diagnosis not present

## 2014-11-17 DIAGNOSIS — M6281 Muscle weakness (generalized): Secondary | ICD-10-CM | POA: Diagnosis not present

## 2014-11-17 NOTE — Telephone Encounter (Signed)
This has been taken care of.

## 2014-12-05 DIAGNOSIS — N210 Calculus in bladder: Secondary | ICD-10-CM | POA: Diagnosis not present

## 2014-12-05 DIAGNOSIS — R3915 Urgency of urination: Secondary | ICD-10-CM | POA: Diagnosis not present

## 2014-12-05 DIAGNOSIS — R351 Nocturia: Secondary | ICD-10-CM | POA: Diagnosis not present

## 2014-12-06 DIAGNOSIS — R3915 Urgency of urination: Secondary | ICD-10-CM | POA: Diagnosis not present

## 2014-12-13 DIAGNOSIS — Z87891 Personal history of nicotine dependence: Secondary | ICD-10-CM | POA: Diagnosis not present

## 2014-12-13 DIAGNOSIS — Z7982 Long term (current) use of aspirin: Secondary | ICD-10-CM | POA: Diagnosis not present

## 2014-12-13 DIAGNOSIS — Z87442 Personal history of urinary calculi: Secondary | ICD-10-CM | POA: Diagnosis not present

## 2014-12-13 DIAGNOSIS — R351 Nocturia: Secondary | ICD-10-CM | POA: Diagnosis not present

## 2014-12-13 DIAGNOSIS — N210 Calculus in bladder: Secondary | ICD-10-CM | POA: Diagnosis not present

## 2014-12-13 DIAGNOSIS — Z79899 Other long term (current) drug therapy: Secondary | ICD-10-CM | POA: Diagnosis not present

## 2014-12-13 DIAGNOSIS — T191XXA Foreign body in bladder, initial encounter: Secondary | ICD-10-CM | POA: Diagnosis not present

## 2014-12-13 DIAGNOSIS — Z882 Allergy status to sulfonamides status: Secondary | ICD-10-CM | POA: Diagnosis not present

## 2014-12-13 DIAGNOSIS — R3915 Urgency of urination: Secondary | ICD-10-CM | POA: Diagnosis not present

## 2014-12-13 DIAGNOSIS — X58XXXA Exposure to other specified factors, initial encounter: Secondary | ICD-10-CM | POA: Diagnosis not present

## 2014-12-14 DIAGNOSIS — N210 Calculus in bladder: Secondary | ICD-10-CM | POA: Diagnosis not present

## 2014-12-18 ENCOUNTER — Telehealth: Payer: Self-pay | Admitting: Pulmonary Disease

## 2014-12-18 DIAGNOSIS — K219 Gastro-esophageal reflux disease without esophagitis: Secondary | ICD-10-CM

## 2014-12-18 DIAGNOSIS — R05 Cough: Secondary | ICD-10-CM

## 2014-12-18 DIAGNOSIS — R059 Cough, unspecified: Secondary | ICD-10-CM

## 2014-12-18 NOTE — Telephone Encounter (Signed)
Per 10/25/14 OV: Patient Instructions       Stop symbicort and albuterol   Will check bloodwork to evaluate for some type of autoimmune disease. You need to have a barium swallow to evaluate for reflux and aspiration.  Please call us when you are able to do this, and we can schedule.   Will call you with the results of your labwork.   Will start on oxygen with exertion, and also check your oxygen level overnight while sleeping   ATC PT line busy x 3 wcb

## 2014-12-19 NOTE — Telephone Encounter (Signed)
Attempted to call pt. Line was busy. Will need to try back later. 

## 2014-12-20 NOTE — Telephone Encounter (Signed)
Schedule for DG esophagus.  (NO speech therapy) Evaluate for reflux and aspiration.

## 2014-12-20 NOTE — Telephone Encounter (Signed)
Order placed for DG Esophagus with specifications per [facility] Pt aware this has been ordered. Nothing further needed.

## 2014-12-20 NOTE — Telephone Encounter (Signed)
Spoke with pt, she is wanting to go ahead and schedule the barium swallow discussed at her last ov.   Kc are you ok with ordering this now?

## 2014-12-25 ENCOUNTER — Encounter: Payer: Self-pay | Admitting: Pulmonary Disease

## 2014-12-25 DIAGNOSIS — J8410 Pulmonary fibrosis, unspecified: Secondary | ICD-10-CM

## 2014-12-25 NOTE — Telephone Encounter (Signed)
Patient says she had an ONO and they gave her the results saying she needed to be on oxygen, [facility] but has not received any oxygen.  Patient wants to know if there is a home care in [place] that takes [place] insurance.  To [facility]:   Can you help patient with this?

## 2014-12-25 NOTE — Telephone Encounter (Signed)
i spoke to [email or web address]  (not [facility]) pt refused concentrator for 02 she only wants poc, however she has to have a standard concentrator in the house for nitetime 02 the poc for exertion only has to be approved several papers have been given to dr clance to sign but have not been returned to [facility] at this point all her sats are over 8630days old and will have to be repeated to qualify for exertion o2 and ono for nite o2, this pt has [employer] insurance and has to use [facility] for her dme services Tobe SosSally E Ottinger

## 2014-12-25 NOTE — Telephone Encounter (Signed)
Dr. Clance please advise thanks 

## 2014-12-26 ENCOUNTER — Ambulatory Visit (HOSPITAL_COMMUNITY)
Admission: RE | Admit: 2014-12-26 | Discharge: 2014-12-26 | Disposition: A | Discharge status: Home / Self Care | Payer: Commercial Managed Care - HMO | Source: Ambulatory Visit | Attending: Pulmonary Disease | Admitting: Pulmonary Disease

## 2014-12-26 DIAGNOSIS — R05 Cough: Secondary | ICD-10-CM

## 2014-12-26 DIAGNOSIS — K219 Gastro-esophageal reflux disease without esophagitis: Secondary | ICD-10-CM

## 2014-12-26 DIAGNOSIS — K449 Diaphragmatic hernia without obstruction or gangrene: Secondary | ICD-10-CM | POA: Insufficient documentation

## 2014-12-26 DIAGNOSIS — K210 Gastro-esophageal reflux disease with esophagitis: Secondary | ICD-10-CM | POA: Insufficient documentation

## 2014-12-26 DIAGNOSIS — R059 Cough, unspecified: Secondary | ICD-10-CM

## 2014-12-26 DIAGNOSIS — K228 Other specified diseases of esophagus: Secondary | ICD-10-CM | POA: Diagnosis not present

## 2014-12-27 ENCOUNTER — Telehealth: Payer: Self-pay | Admitting: Pulmonary Disease

## 2014-12-27 NOTE — Telephone Encounter (Signed)
Called pt and is scheduled to come in next week for re qualifying sats in office. ONO order as well. Nothing further needed

## 2014-12-27 NOTE — Telephone Encounter (Signed)
LMTCB x 1 

## 2014-12-27 NOTE — Telephone Encounter (Signed)
[name], please arrange for whatever has to be done to get her recertified.  Thanks.

## 2014-12-28 ENCOUNTER — Telehealth: Payer: Self-pay | Admitting: Pulmonary Disease

## 2014-12-28 ENCOUNTER — Other Ambulatory Visit: Payer: Self-pay | Admitting: Pulmonary Disease

## 2014-12-28 DIAGNOSIS — R059 Cough, unspecified: Secondary | ICD-10-CM

## 2014-12-28 DIAGNOSIS — K2100 Gastro-esophageal reflux disease with esophagitis, without bleeding: Secondary | ICD-10-CM

## 2014-12-28 DIAGNOSIS — R05 Cough: Secondary | ICD-10-CM

## 2014-12-28 NOTE — Telephone Encounter (Signed)
[phone] Kendal HymenBonnie Daughter calling back

## 2014-12-28 NOTE — Telephone Encounter (Signed)
LMTCB x 1 

## 2014-12-28 NOTE — Telephone Encounter (Signed)
Spoke with pt - states that she would like to give a suggestion for where to schedule her speech appt. Pt [place] a [place] in [place], [place] if possible.  [facility], this referral is in the referral box.

## 2014-12-28 NOTE — Telephone Encounter (Signed)
Spoke with [name], states that they received a call from Medicare stating that they are unable to use her qualifying sats from 10/25/14 for O2 as they have expired. O2 sats when sent to Medicare have to be within a 30 day window. Advised that I would call [facility] to discuss. Confirmed pt is scheduled for appt to re-qualify 01/01/15. Pt daughter states that patient is low on funds and wants to ensure that she is not going to be charged another copay for this visit.  ------ [facility]aryn [facility] spoke with Okey RegalCarol, states that they went back and forth with our office for several weeks to figure out what was needed for patient. Orders placed on 10/25/14, 11/13/14 and 11/14/14 for POC, ONO and nocturnal O2. Qualifying sats expired as of 11/25/14, nothing was sent to Medicare per Okey Regalarol because they could not figure out what exactly was needed to be ordered.  Okey RegalCarol states that they now have everything needed and all they need are qualifying sats after pts appt on 3/21. ------ Spoke with [name] - pt daughter, aware that there will be no charge for appt on 3/21 (nurse visit). Aware that she will be billed for the walk itself but not a copay for visit.  (I verified with PCC's and Okey RegalCarol that new OV is not needed, just sats) Nothing further needed.

## 2014-12-29 ENCOUNTER — Other Ambulatory Visit: Payer: Self-pay | Admitting: Pulmonary Disease

## 2014-12-29 DIAGNOSIS — T17908A Unspecified foreign body in respiratory tract, part unspecified causing other injury, initial encounter: Secondary | ICD-10-CM | POA: Insufficient documentation

## 2014-12-29 DIAGNOSIS — R3915 Urgency of urination: Secondary | ICD-10-CM | POA: Diagnosis not present

## 2014-12-29 DIAGNOSIS — N210 Calculus in bladder: Secondary | ICD-10-CM | POA: Diagnosis not present

## 2014-12-29 DIAGNOSIS — R351 Nocturia: Secondary | ICD-10-CM | POA: Diagnosis not present

## 2014-12-29 NOTE — Telephone Encounter (Signed)
[name], a speech therapist is not a doctor. If she would like to go to someone in [place], would send [facility]  Make sure they know this is not a swallowing evaluation.  We already know she aspirates.  She needs actual therapy

## 2014-12-29 NOTE — Telephone Encounter (Signed)
Kc i need to verify this i dont know of any one in [place] for this pt to see is this an appt with like carl shinke?

## 2015-01-01 ENCOUNTER — Ambulatory Visit (INDEPENDENT_AMBULATORY_CARE_PROVIDER_SITE_OTHER): Payer: Commercial Managed Care - HMO

## 2015-01-01 DIAGNOSIS — J8410 Pulmonary fibrosis, unspecified: Secondary | ICD-10-CM | POA: Diagnosis not present

## 2015-01-01 NOTE — Progress Notes (Signed)
Nurse visit to requalify for exertional O2 Order placed to [facility] as previously done

## 2015-01-02 ENCOUNTER — Encounter: Payer: Self-pay | Admitting: Pulmonary Disease

## 2015-01-02 NOTE — Telephone Encounter (Signed)
Information faxed to [facility] they will call pt with an appt pt is aware of this [name]

## 2015-01-03 DIAGNOSIS — J8410 Pulmonary fibrosis, unspecified: Secondary | ICD-10-CM | POA: Diagnosis not present

## 2015-01-03 DIAGNOSIS — I10 Essential (primary) hypertension: Secondary | ICD-10-CM | POA: Diagnosis not present

## 2015-01-03 DIAGNOSIS — E782 Mixed hyperlipidemia: Secondary | ICD-10-CM | POA: Diagnosis not present

## 2015-01-03 DIAGNOSIS — K210 Gastro-esophageal reflux disease with esophagitis: Secondary | ICD-10-CM | POA: Diagnosis not present

## 2015-01-03 DIAGNOSIS — R05 Cough: Secondary | ICD-10-CM | POA: Diagnosis not present

## 2015-01-10 ENCOUNTER — Telehealth: Payer: Self-pay | Admitting: Pulmonary Disease

## 2015-01-10 DIAGNOSIS — J301 Allergic rhinitis due to pollen: Secondary | ICD-10-CM | POA: Diagnosis not present

## 2015-01-10 DIAGNOSIS — I2510 Atherosclerotic heart disease of native coronary artery without angina pectoris: Secondary | ICD-10-CM | POA: Diagnosis not present

## 2015-01-10 DIAGNOSIS — I10 Essential (primary) hypertension: Secondary | ICD-10-CM | POA: Diagnosis not present

## 2015-01-10 DIAGNOSIS — E782 Mixed hyperlipidemia: Secondary | ICD-10-CM | POA: Diagnosis not present

## 2015-01-10 DIAGNOSIS — H8113 Benign paroxysmal vertigo, bilateral: Secondary | ICD-10-CM | POA: Diagnosis not present

## 2015-01-10 DIAGNOSIS — J849 Interstitial pulmonary disease, unspecified: Secondary | ICD-10-CM | POA: Diagnosis not present

## 2015-01-10 DIAGNOSIS — K219 Gastro-esophageal reflux disease without esophagitis: Secondary | ICD-10-CM | POA: Diagnosis not present

## 2015-01-10 NOTE — Telephone Encounter (Signed)
Spoke with [place]. Order will refaxed for speech pathology. Nothing further was needed.

## 2015-01-10 NOTE — Telephone Encounter (Signed)
A faxed has been received from [place] for the order of the modified barium swallow. This has been signed by [facility] and faxed back. Nothing further was needed.

## 2015-01-16 ENCOUNTER — Encounter: Payer: Self-pay | Admitting: Pulmonary Disease

## 2015-01-29 ENCOUNTER — Telehealth: Payer: Self-pay | Admitting: Pulmonary Disease

## 2015-01-29 NOTE — Telephone Encounter (Signed)
Let pt know that she drops her oxygen level to 74% during the night, and spent less than 88%.  Ok to order oxygen at 2 liters with sleep if she is willing to wear.

## 2015-01-30 ENCOUNTER — Encounter: Payer: Self-pay | Admitting: Pulmonary Disease

## 2015-01-30 NOTE — Telephone Encounter (Signed)
I spoke with patient about results and she verbalized understanding and had no questions. Pt has already been set up on O2.

## 2015-02-03 DIAGNOSIS — I10 Essential (primary) hypertension: Secondary | ICD-10-CM | POA: Diagnosis not present

## 2015-02-03 DIAGNOSIS — J8410 Pulmonary fibrosis, unspecified: Secondary | ICD-10-CM | POA: Diagnosis not present

## 2015-02-03 DIAGNOSIS — R05 Cough: Secondary | ICD-10-CM | POA: Diagnosis not present

## 2015-02-13 DIAGNOSIS — R3915 Urgency of urination: Secondary | ICD-10-CM | POA: Diagnosis not present

## 2015-03-02 ENCOUNTER — Encounter: Payer: Self-pay | Admitting: Pulmonary Disease

## 2015-03-02 ENCOUNTER — Ambulatory Visit (INDEPENDENT_AMBULATORY_CARE_PROVIDER_SITE_OTHER): Payer: Commercial Managed Care - HMO | Admitting: Pulmonary Disease

## 2015-03-02 VITALS — BP 124/72 | HR 68 | Temp 97.2°F | Ht 63.0 in | Wt 132.8 lb

## 2015-03-02 DIAGNOSIS — J8410 Pulmonary fibrosis, unspecified: Secondary | ICD-10-CM | POA: Diagnosis not present

## 2015-03-02 NOTE — Patient Instructions (Signed)
Will refer you back to [facility].  Please call them to reschedule apptm Continue on oxygen  Will have you come back in 2 mos to establish with Dr. Kendrick FriesMcQuaid, who will be following you going forward.

## 2015-03-02 NOTE — Progress Notes (Signed)
   Subjective:    Patient ID: [name], female    DOB: March 29, 1941, 74 y.o.   MRN: [id number]  HPI The patient comes in today for follow-up of her known interstitial lung disease with chronic respiratory failure. I have been very concerned about aspiration, and sent the patient for a barium swallow. Found to have esophageal dysmotility, and did aspirate barium into her trachea. I referred her to speech therapy for further evaluation, but the patient did not go. She currently feels that her breathing is at her usual baseline, and is wearing oxygen about 12 hours a day. Avenue a chronic dry cough, but is not producing purulence.   Review of Systems  Constitutional: Negative for fever and unexpected weight change.  HENT: Negative for congestion, dental problem, ear pain, nosebleeds, postnasal drip, rhinorrhea, sinus pressure, sneezing, sore throat and trouble swallowing.   Eyes: Negative for redness and itching.  Respiratory: Positive for cough and shortness of breath. Negative for chest tightness and wheezing.   Cardiovascular: Negative for palpitations and leg swelling.  Gastrointestinal: Negative for nausea and vomiting.  Genitourinary: Negative for dysuria.  Musculoskeletal: Negative for joint swelling.  Skin: Negative for rash.  Neurological: Negative for headaches.  Hematological: Does not bruise/bleed easily.  Psychiatric/Behavioral: Negative for dysphoric mood. The patient is not nervous/anxious.        Objective:   Physical Exam Well-developed female in no acute distress Nose without purulence or discharge noted Neck without lymphadenopathy or thyromegaly Chest with crackles one half the way up bilaterally, no wheezing Cardiac exam with regular rate and rhythm Lower extremities with minimal edema, no cyanosis Alert and oriented, moves all 4 extremities.       Assessment & Plan:

## 2015-03-02 NOTE — Assessment & Plan Note (Addendum)
The patient has progressive pulmonary fibrosis, but also a very complicated history to go along with it. She has a history of fiber proliferative ARDS in 2004, has a history of intermittent exposure to Macrodantin, and now is found to have esophageal dysmotility with aspiration of barium during a recent barium swallow. I have referred her to speech therapy to be evaluated, but the patient did not go to the appointment. I have stressed to her the importance of the swallowing evaluation, and that her aspiration may be responsible for her advancing pulmonary disease. I still cannot exclude the possibility of having idiopathic pulmonary fibrosis, but would await her swallowing evaluation before doing any further workup.  Of note, I had asked the patient to stop her Symbicort since she did not have significant airflow obstruction, but she wants to continue this on an as-needed basis. I have explained that she does not need the medication.

## 2015-03-05 DIAGNOSIS — R05 Cough: Secondary | ICD-10-CM | POA: Diagnosis not present

## 2015-03-05 DIAGNOSIS — J8410 Pulmonary fibrosis, unspecified: Secondary | ICD-10-CM | POA: Diagnosis not present

## 2015-03-05 DIAGNOSIS — I10 Essential (primary) hypertension: Secondary | ICD-10-CM | POA: Diagnosis not present

## 2015-03-08 ENCOUNTER — Encounter: Payer: Self-pay | Admitting: Pulmonary Disease

## 2015-03-14 ENCOUNTER — Encounter: Payer: Self-pay | Admitting: Pulmonary Disease

## 2015-03-14 DIAGNOSIS — T17908A Unspecified foreign body in respiratory tract, part unspecified causing other injury, initial encounter: Secondary | ICD-10-CM

## 2015-03-16 DIAGNOSIS — R3915 Urgency of urination: Secondary | ICD-10-CM | POA: Diagnosis not present

## 2015-03-16 NOTE — Telephone Encounter (Signed)
Order sent 12/29/14.  See under heading of "referral".  Will need to get pcc back involved.

## 2015-03-16 NOTE — Telephone Encounter (Signed)
[facility] please advise. [facility]?

## 2015-03-23 ENCOUNTER — Encounter: Payer: Self-pay | Admitting: Pulmonary Disease

## 2015-03-23 DIAGNOSIS — T17908A Unspecified foreign body in respiratory tract, part unspecified causing other injury, initial encounter: Secondary | ICD-10-CM

## 2015-03-23 NOTE — Telephone Encounter (Signed)
Ok to send order for speech therapy

## 2015-03-23 NOTE — Telephone Encounter (Signed)
Patient sent message through MyChart wants to know if she needs to see Speech Therapist.  She says that if she needs appointment with Speech Therapy, she will need referral from our office.   [facility] - please advise.

## 2015-04-02 ENCOUNTER — Telehealth: Payer: Self-pay | Admitting: *Deleted

## 2015-04-02 DIAGNOSIS — T17908A Unspecified foreign body in respiratory tract, part unspecified causing other injury, initial encounter: Secondary | ICD-10-CM

## 2015-04-02 NOTE — Telephone Encounter (Signed)
OK by me 

## 2015-04-02 NOTE — Telephone Encounter (Signed)
-----   Message from Tobe Sos sent at 04/02/2015  3:10 PM EDT ----- i know but [facility] said she had to have one to do their program ----- Message -----    From: Tommie Sams, CMA    Sent: 04/02/2015   2:57 PM      To: Carola Rhine never ordered a modified barium swallow for her just speech therapy based off her DG esophagus in march. Is this still needed to order? ----- Message -----    From: Tobe Sos    Sent: 04/02/2015   2:53 PM      To: Tommie Sams, CMA  Hey Lorilynn Lehr i need this order changed from [facility] to dr [facility] i need speech pathology and a modified barium swollow thanks libby

## 2015-04-02 NOTE — Telephone Encounter (Signed)
Dr. Kendrick Fries, please advise if you are okay with [place] ordering modified barium swallow and speech therapy on pt? thanks

## 2015-04-03 NOTE — Telephone Encounter (Signed)
Orders placed. Nothing further needed at this time  

## 2015-04-04 ENCOUNTER — Encounter: Payer: Self-pay | Admitting: Pulmonary Disease

## 2015-04-04 NOTE — Telephone Encounter (Signed)
Dr. Kendrick Fries, do you feel comfortable with this patient joining a health club?  Thanks!

## 2015-04-05 DIAGNOSIS — I10 Essential (primary) hypertension: Secondary | ICD-10-CM | POA: Diagnosis not present

## 2015-04-05 DIAGNOSIS — R05 Cough: Secondary | ICD-10-CM | POA: Diagnosis not present

## 2015-04-05 DIAGNOSIS — J8410 Pulmonary fibrosis, unspecified: Secondary | ICD-10-CM | POA: Diagnosis not present

## 2015-04-12 ENCOUNTER — Telehealth: Payer: Self-pay | Admitting: *Deleted

## 2015-04-12 NOTE — Telephone Encounter (Signed)
-----   Message from Lupita Leashouglas B McQuaid, MD sent at 04/12/2015  6:32 AM EDT ----- Please let her know that it is OK for her to join a health club thanks

## 2015-04-12 NOTE — Telephone Encounter (Signed)
Pt is aware of the below information. 

## 2015-04-19 ENCOUNTER — Other Ambulatory Visit: Payer: Self-pay | Admitting: Pulmonary Disease

## 2015-04-19 DIAGNOSIS — R1310 Dysphagia, unspecified: Secondary | ICD-10-CM

## 2015-04-21 ENCOUNTER — Encounter: Payer: Self-pay | Admitting: Pulmonary Disease

## 2015-04-23 ENCOUNTER — Ambulatory Visit (HOSPITAL_COMMUNITY): Payer: Commercial Managed Care - HMO | Admitting: Speech Pathology

## 2015-04-25 ENCOUNTER — Other Ambulatory Visit (HOSPITAL_COMMUNITY): Payer: Commercial Managed Care - HMO

## 2015-04-26 ENCOUNTER — Ambulatory Visit (HOSPITAL_COMMUNITY)
Admission: RE | Admit: 2015-04-26 | Discharge: 2015-04-26 | Disposition: A | Discharge status: Home / Self Care | Payer: Commercial Managed Care - HMO | Source: Ambulatory Visit | Attending: Pulmonary Disease | Admitting: Pulmonary Disease

## 2015-04-26 ENCOUNTER — Ambulatory Visit (HOSPITAL_COMMUNITY): Payer: Commercial Managed Care - HMO | Attending: Pulmonary Disease | Admitting: Speech Pathology

## 2015-04-26 DIAGNOSIS — J449 Chronic obstructive pulmonary disease, unspecified: Secondary | ICD-10-CM | POA: Diagnosis not present

## 2015-04-26 DIAGNOSIS — R1314 Dysphagia, pharyngoesophageal phase: Secondary | ICD-10-CM | POA: Insufficient documentation

## 2015-04-26 DIAGNOSIS — R1310 Dysphagia, unspecified: Secondary | ICD-10-CM | POA: Diagnosis not present

## 2015-04-26 DIAGNOSIS — T17908D Unspecified foreign body in respiratory tract, part unspecified causing other injury, subsequent encounter: Secondary | ICD-10-CM

## 2015-04-26 NOTE — Therapy (Signed)
[place] [facility] [address] [place], [place], [address] Phone: [phone]   Fax:  [phone]  Modified Barium Swallow  Patient Details  Name: [name] MRN: [id number] Date of Birth: [birth year] Referring Provider:  Lupita Leash, MD  Encounter Date: 04/26/2015      End of Session - 04/26/15 1603    Visit Number 1   Number of Visits 1   Authorization Type [employer]   SLP Start Time 1303   SLP Stop Time  1331   SLP Time Calculation (min) 28 min   Activity Tolerance Patient tolerated treatment well      Past Medical History  Diagnosis Date  . Hyperlipidemia   . Allergic rhinitis   . GERD (gastroesophageal reflux disease)   . COPD (chronic obstructive pulmonary disease)   . Intrinsic sphincter deficiency   . Arteriosclerotic heart disease     Past Surgical History  Procedure Laterality Date  . Gallbladder surgery    . Abdominal hysterectomy    . Arm surgery      left arm  . Bladder surgery      There were no vitals filed for this visit.  Visit Diagnosis: Dysphagia, pharyngoesophageal phase      Subjective Assessment - 04/26/15 1551    Subjective "I don't get choked, but sometimes it feels like meat won't go down."   Special Tests MBSS   Currently in Pain? No/denies             General - 04/26/15 1552    General Information   Date of Onset 10/13/14   Other Pertinent Information Ms. [name] is a 74 yo female who was referred for MBSS by Dr. Kendrick Fries due to barium swallow study in March 2016 which showed: Moderately advanced presbyesophagus with esophageal dysmotility. No esophageal mass or stricture. Small hiatal hernia with moderate gastroesophageal reflux. The patient aspirated during the study. Barium was noted noted in the trachea. There was no cough reflex from aspiration. Additional lateral imaging of pharyngeal phase of swallowing revealed no additional aspiration. This did reveal mild  penetration. Barium tablet passed readily into the stomach without delay. No esophageal stricture. The patient is a former patient of Dr. Shelle Iron, but now sees Dr. Kendrick Fries (pulmonary). Dr. Teddy Spike most recent note reveals: <<The patient has progressive pulmonary fibrosis, but also a very complicated history to go along with it. She has a history of fiber proliferative ARDS in 2004, has a history of intermittent exposure to Macrodantin, and now is found to have esophageal dysmotility with aspiration of barium during a recent barium swallow. I have referred her to speech therapy to be evaluated, but the patient did not go to the appointment. I have stressed to her the importance of the swallowing evaluation, and that her aspiration may be responsible for her advancing pulmonary disease. I still cannot exclude the possibility of having idiopathic pulmonary fibrosis, but would await her swallowing evaluation before doing any further workup. Of note, I had asked the patient to stop her Symbicort since she did not have significant airflow obstruction, but she wants to continue this on an as-needed basis. I have explained that she does not need the medication>>.    Type of Study Other (Comment)  MBSS   Reason for Referral Objectively evaluate swallowing function   Previous Swallow Assessment Barium swallow study in March 2016   Diet Prior to this Study Regular;Thin liquids   Temperature Spikes Noted No   Respiratory Status  Room air   History of Recent Intubation No   Behavior/Cognition Alert;Cooperative;Pleasant mood   Oral Cavity - Dentition Adequate natural dentition/normal for age   Oral Motor / Sensory Function Within functional limits   Self-Feeding Abilities Able to feed self   Patient Positioning Upright in chair/Tumbleform   Baseline Vocal Quality Normal   Volitional Cough Strong   Volitional Swallow Able to elicit   Anatomy Within functional limits   Pharyngeal Secretions Not observed secondary  MBS            Oral Preparation/Oral Phase - 10-May-2015 1554    Oral Preparation/Oral Phase   Oral Phase Within functional limits   Electrical stimulation - Oral Phase   Was Electrical Stimulation Used No          Pharyngeal Phase - 2015-05-10 1554    Pharyngeal Phase   Pharyngeal Phase Impaired   Pharyngeal - Thin   Pharyngeal- Thin Cup Swallow initiation at pyriform sinus  mild premature spillage   Pharyngeal- Thin Straw Swallow initiation at pyriform sinus   Pharyngeal - Solids   Pharyngeal- Puree Within functional limits;Swallow initiation at vallecula   Pharyngeal- Mechanical Soft Within functional limits  regular textures   Pharyngeal- Pill Within functional limits   Pharyngeal Phase - Comment   Pharyngeal Comment No penetration or aspiration observed today; pill transiently delayed in thoracic esophagus near aortic arch which was cleared by several swallows of liquid (pt with no awareness of brief stasis)   Electrical Stimulation - Pharyngeal Phase   Was Electrical Stimulation Used No          Cricopharyngeal Phase - 2015-05-10 1557    Cervical Esophageal Phase   Cervical Esophageal Phase Within functional limits            Plan - 05/10/2015 1604    Clinical Impression Statement Pt presents with essentially WNL oropharyngeal swallow with consistencies and textures presented (thin barium, puree, regular textures, barium tablet). Pt with mild premature spillage with cup and straw sips of thin liquid with swallow trigger after spilling to the pyriforms. No penetration or aspiration observed throughout the study. Barium tablet was transiently delayed in thoracic esophagus near aortic arch but cleared after several swallows of thin liquid. Pt was unaware of brief stasis, but does subjectively complain of occasional difficulty swallowing meats (points to sternal area as place of globus). Previous barium swallow images reviewed from March 2016 and a small amount of barium is  noted on a still image in trachea, however the actual aspiration was not observed during the swallow- trace penetration observed  during the swallow on a fluoro/moving image. Pt does not appear to be at risk for aspiration/penetration at this time given today's study. Recommend regular textures and thin/all liquids with reflux precautions. No further SLP intervention indicated at this time. Pt requested that this report be faxed to Dr. Kendrick Fries and Dr. Aurther Loft.          G-Codes - 10-May-2015 1614    Functional Assessment Tool Used MBSS; clinical judgement   Functional Limitations Swallowing   Swallow Current Status (X5284) 0 percent impaired, limited or restricted   Swallow Goal Status (X3244) 0 percent impaired, limited or restricted   Swallow Discharge Status [id number]) 0 percent impaired, limited or restricted          Recommendations/Treatment - 05-10-15 1557    Swallow Evaluation Recommendations   SLP Diet Recommendations Thin  regular textures   Liquid Administration via Cup;Straw   Medication Administration Whole meds  with liquid   Supervision Patient able to self feed   Compensations Follow solids with liquid   Postural Changes Remain semi-upright after after feeds/meals (Comment);Seated upright at 90 degrees          Prognosis - 04/26/15 1603    Prognosis   Prognosis for Safe Diet Advancement Good   Individuals Consulted   Consulted and Agree with Results and Recommendations Patient   Report Sent to  Referring physician      Problem List Patient Active Problem List   Diagnosis Date Noted  . Aspiration into airway 12/29/2014  . HYPERLIPIDEMIA 03/29/2008  . HYPERTENSION 03/29/2008  . COUGH 03/29/2008  . DEPRESSION 03/28/2008  . PULMONARY FIBROSIS 03/28/2008   Thank you,  Havery MorosDabney Santia Labate, CCC-SLP [phone]  [facility],Dejour Vos 04/26/2015, 4:15 PM  [facility] [address] [place], [place], [address] Phone: [phone]    Fax:  [phone]

## 2015-04-30 ENCOUNTER — Ambulatory Visit (HOSPITAL_COMMUNITY): Payer: Commercial Managed Care - HMO | Admitting: Speech Pathology

## 2015-04-30 ENCOUNTER — Encounter (HOSPITAL_COMMUNITY): Payer: Commercial Managed Care - HMO | Admitting: Speech Pathology

## 2015-05-04 ENCOUNTER — Ambulatory Visit (INDEPENDENT_AMBULATORY_CARE_PROVIDER_SITE_OTHER): Payer: Commercial Managed Care - HMO | Admitting: Pulmonary Disease

## 2015-05-04 ENCOUNTER — Encounter: Payer: Commercial Managed Care - HMO | Admitting: Pulmonary Disease

## 2015-05-04 ENCOUNTER — Encounter: Payer: Self-pay | Admitting: Pulmonary Disease

## 2015-05-04 VITALS — BP 132/80 | HR 64 | Ht 63.0 in | Wt 133.4 lb

## 2015-05-04 DIAGNOSIS — J9611 Chronic respiratory failure with hypoxia: Secondary | ICD-10-CM | POA: Diagnosis not present

## 2015-05-04 DIAGNOSIS — T17998D Other foreign object in respiratory tract, part unspecified causing other injury, subsequent encounter: Secondary | ICD-10-CM | POA: Diagnosis not present

## 2015-05-04 DIAGNOSIS — J8410 Pulmonary fibrosis, unspecified: Secondary | ICD-10-CM

## 2015-05-04 DIAGNOSIS — T17908D Unspecified foreign body in respiratory tract, part unspecified causing other injury, subsequent encounter: Secondary | ICD-10-CM

## 2015-05-04 NOTE — Assessment & Plan Note (Signed)
Continue 2 L of oxygen continuously. 

## 2015-05-04 NOTE — Assessment & Plan Note (Signed)
I have personally reviewed the images from the CT scan of her chest from November 2015 which showed left greater than right peripheral predominant interstitial changes with honeycombing in the bases. There is also patchy subsegmental thickening which is fairly nonspecific more in the left. The right lung is not nearly as involved but there is some mild centrilobular emphysema.  Clearly, her pulmonary fibrosis has progressed as her lung function testing has worsened over the years and she became symptomatic with respiratory symptoms for the first time about 6 months ago. Given her demographic I worry that this is usual interstitial pneumonitis. This would be very unusual for a late complication of acute respiratory distress syndrome. She has had intermittent Macrodantin exposure but I don't think long enough to have this type of disease.  I have also personally reviewed the results of her barium swallow and there is no clear evidence of aspiration. Clinically, she is not having problems swallowing nor does she choke when she eats.  Plan: We will get a high resolution CT scan of the chest for better imaging and hopefully solidify whether or not we think this is usual interstitial pneumonitis If she has UIP on high resolution CT chest and we will discuss anti-fibrotic therapy, I briefly discussed this with her today in clinic Because she has some mild emphysema seen on her CT chest I'm okay with her using Symbicort, but I would like for her to use it routinely for the next 3-4 weeks and let me know if it helps. Otherwise, if she has no benefit from the Symbicort is no reason to use it because she has no airflow obstruction on pulmonary function testing. Follow-up 4 weeks or sooner if needed

## 2015-05-04 NOTE — Patient Instructions (Signed)
Take the Symbicort 2 puffs twice a day no matter how you feel We will schedule a high-resolution CT scan of the chest and see you back after that We will see you back in 4 weeks or sooner if needed

## 2015-05-04 NOTE — Progress Notes (Signed)
Subjective:    Patient ID: [name], female    DOB: January 02, 1941, 74 y.o.   MRN: [id number]  Synopsis: Former [facility] patient with fibrosis PFT"s 2009:  No obstruction, TLC 3.23 (70%), DLCO 9.4 (50%) CT chest 08/2014:  Volume loss left hemithorax, fibrotic changes L > R, subpleural reticulation with a few areas of honeycombing.  (progressive by CXR from 2009) Episode of FPARDS 2004, +exposure to macrodantin (but not on chronically) PFT"s 2016:  No obstruction, TLC 2.47 (50%), DLCO 7.90 (34%) Autoimmune labwork 10/2014:  Negative Barium swallow 2016:  +esophageal dysmotility and +aspiration noted. >> referred to speech.  04/2015 Modified barium swallow: no clear aspiration  HPI Chief Complaint  Patient presents with  . Follow-up    Former [facility] pt. Pt c/o stable dyspnea. Currently on 2L cont O2. Pt states she feels the heat is making her symptoms worse    She has been aware of some abnormality in her lungs for several years.  She had ARDS in 2004 after a heart attack and has been aware of scarring since then.  She saw my partner Synthia Innocent at that time.  She was on a ventilator for 3 months around that time.  No trips to the hospital for respiratory problems since then. She started having more coughing this year and so she was referred here.  This developed over a few months in the last year ad was associated with dyspnea and hypoxemia.   She doesn't feel like food goes down the wrong pipe, no overt aspiration.   Past Medical History  Diagnosis Date  . Hyperlipidemia   . Allergic rhinitis   . GERD (gastroesophageal reflux disease)   . COPD (chronic obstructive pulmonary disease)   . Intrinsic sphincter deficiency   . Arteriosclerotic heart disease       Review of Systems  Constitutional: Negative for fever, diaphoresis and fatigue.  HENT: Negative for postnasal drip, rhinorrhea and sinus pressure.   Respiratory: Positive for cough and shortness of breath. Negative for wheezing.     Cardiovascular: Negative for chest pain, palpitations and leg swelling.       Objective:   Physical Exam Filed Vitals:   05/04/15 1437  BP: 132/80  Pulse: 64  Height: 5\' 3"  (1.6 m)  Weight: 133 lb 6.4 oz (60.51 kg)  SpO2: 93%   2L   Gen: chronically ill appearing HENT: OP clear, TM's clear, neck supple PULM: Crackles 1/2 way up bilaterally, normal effort CV: RRR, no mgr, trace edema GI: BS+, soft, nontender Derm: no cyanosis or rash Psyche: normal mood and affect  I have reviewed the previous pulmonary records as well as the images from the CT chest, see below      Assessment & Plan:  PULMONARY FIBROSIS I have personally reviewed the images from the CT scan of her chest from November 2015 which showed left greater than right peripheral predominant interstitial changes with honeycombing in the bases. There is also patchy subsegmental thickening which is fairly nonspecific more in the left. The right lung is not nearly as involved but there is some mild centrilobular emphysema.  Clearly, her pulmonary fibrosis has progressed as her lung function testing has worsened over the years and she became symptomatic with respiratory symptoms for the first time about 6 months ago. Given her demographic I worry that this is usual interstitial pneumonitis. This would be very unusual for a late complication of acute respiratory distress syndrome. She has had intermittent Macrodantin exposure but I  don't think long enough to have this type of disease.  I have also personally reviewed the results of her barium swallow and there is no clear evidence of aspiration. Clinically, she is not having problems swallowing nor does she choke when she eats.  Plan: We will get a high resolution CT scan of the chest for better imaging and hopefully solidify whether or not we think this is usual interstitial pneumonitis If she has UIP on high resolution CT chest and we will discuss anti-fibrotic therapy, I  briefly discussed this with her today in clinic Because she has some mild emphysema seen on her CT chest I'm okay with her using Symbicort, but I would like for her to use it routinely for the next 3-4 weeks and let me know if it helps. Otherwise, if she has no benefit from the Symbicort is no reason to use it because she has no airflow obstruction on pulmonary function testing. Follow-up 4 weeks or sooner if needed  Aspiration into airway There is no evidence of this on the modified barium swallow. However, prior image on some suggestion of contrast material in the trachea. The patient reports no history of dysphagia and modified barium swallowing test did not show that.  Chronic hypoxemic respiratory failure Continue 2 L of oxygen continuously    Current outpatient prescriptions:  .  aspirin 81 MG tablet, Take 81 mg by mouth daily., Disp: , Rfl:  .  desmopressin (DDAVP) 0.2 MG tablet, , Disp: , Rfl: 0 .  Fish Oil-Cholecalciferol (FISH OIL + D3 PO), Take by mouth daily. , Disp: , Rfl:  .  glucosamine-chondroitin 500-400 MG tablet, Take 1 tablet by mouth daily. , Disp: , Rfl:  .  meclizine (ANTIVERT) 12.5 MG tablet, Take 12.5 mg by mouth 3 (three) times daily as needed for dizziness., Disp: , Rfl:  .  Melatonin 3 MG CAPS, Take 1 capsule by mouth as needed. , Disp: , Rfl:  .  metoprolol succinate (TOPROL-XL) 50 MG 24 hr tablet, Take 50 mg by mouth daily. Take with or immediately following a meal., Disp: , Rfl:  .  mirtazapine (REMERON) 15 MG tablet, Take 15 mg by mouth at bedtime., Disp: , Rfl:  .  Multiple Vitamin (MULTIVITAMIN) tablet, Take 1 tablet by mouth daily., Disp: , Rfl:  .  MYRBETRIQ 50 MG TB24 tablet, , Disp: , Rfl: 1 .  omeprazole (PRILOSEC) 20 MG capsule, Take 20 mg by mouth daily., Disp: , Rfl:  .  simvastatin (ZOCOR) 40 MG tablet, Take 40 mg by mouth daily., Disp: , Rfl:  .  traMADol (ULTRAM) 50 MG tablet, 3 (three) times daily. For cough by Dr. Garner Nash, Disp: , Rfl: 0 .   budesonide-formoterol (SYMBICORT) 160-4.5 MCG/ACT inhaler, Inhale 2 puffs into the lungs as needed. , Disp: , Rfl:

## 2015-05-04 NOTE — Assessment & Plan Note (Signed)
There is no evidence of this on the modified barium swallow. However, prior image on some suggestion of contrast material in the trachea. The patient reports no history of dysphagia and modified barium swallowing test did not show that.

## 2015-05-05 DIAGNOSIS — R05 Cough: Secondary | ICD-10-CM | POA: Diagnosis not present

## 2015-05-05 DIAGNOSIS — J8410 Pulmonary fibrosis, unspecified: Secondary | ICD-10-CM | POA: Diagnosis not present

## 2015-05-05 DIAGNOSIS — I10 Essential (primary) hypertension: Secondary | ICD-10-CM | POA: Diagnosis not present

## 2015-05-09 ENCOUNTER — Ambulatory Visit (INDEPENDENT_AMBULATORY_CARE_PROVIDER_SITE_OTHER)
Admission: RE | Admit: 2015-05-09 | Discharge: 2015-05-09 | Disposition: A | Discharge status: Home / Self Care | Payer: Commercial Managed Care - HMO | Source: Ambulatory Visit | Attending: Pulmonary Disease | Admitting: Pulmonary Disease

## 2015-05-09 DIAGNOSIS — R0602 Shortness of breath: Secondary | ICD-10-CM | POA: Diagnosis not present

## 2015-05-09 DIAGNOSIS — J84112 Idiopathic pulmonary fibrosis: Secondary | ICD-10-CM

## 2015-05-09 DIAGNOSIS — J8410 Pulmonary fibrosis, unspecified: Secondary | ICD-10-CM | POA: Diagnosis not present

## 2015-05-16 DIAGNOSIS — R351 Nocturia: Secondary | ICD-10-CM | POA: Diagnosis not present

## 2015-05-16 DIAGNOSIS — K219 Gastro-esophageal reflux disease without esophagitis: Secondary | ICD-10-CM | POA: Diagnosis not present

## 2015-05-16 DIAGNOSIS — R3915 Urgency of urination: Secondary | ICD-10-CM | POA: Diagnosis not present

## 2015-05-16 DIAGNOSIS — I10 Essential (primary) hypertension: Secondary | ICD-10-CM | POA: Diagnosis not present

## 2015-05-16 DIAGNOSIS — N939 Abnormal uterine and vaginal bleeding, unspecified: Secondary | ICD-10-CM | POA: Diagnosis not present

## 2015-05-16 DIAGNOSIS — E782 Mixed hyperlipidemia: Secondary | ICD-10-CM | POA: Diagnosis not present

## 2015-05-23 DIAGNOSIS — E782 Mixed hyperlipidemia: Secondary | ICD-10-CM | POA: Diagnosis not present

## 2015-05-23 DIAGNOSIS — I2510 Atherosclerotic heart disease of native coronary artery without angina pectoris: Secondary | ICD-10-CM | POA: Diagnosis not present

## 2015-05-23 DIAGNOSIS — N950 Postmenopausal bleeding: Secondary | ICD-10-CM | POA: Diagnosis not present

## 2015-05-23 DIAGNOSIS — K219 Gastro-esophageal reflux disease without esophagitis: Secondary | ICD-10-CM | POA: Diagnosis not present

## 2015-05-23 DIAGNOSIS — J849 Interstitial pulmonary disease, unspecified: Secondary | ICD-10-CM | POA: Diagnosis not present

## 2015-05-23 DIAGNOSIS — I10 Essential (primary) hypertension: Secondary | ICD-10-CM | POA: Diagnosis not present

## 2015-05-23 DIAGNOSIS — J301 Allergic rhinitis due to pollen: Secondary | ICD-10-CM | POA: Diagnosis not present

## 2015-05-23 DIAGNOSIS — H8113 Benign paroxysmal vertigo, bilateral: Secondary | ICD-10-CM | POA: Diagnosis not present

## 2015-05-24 DIAGNOSIS — E782 Mixed hyperlipidemia: Secondary | ICD-10-CM | POA: Diagnosis not present

## 2015-05-24 DIAGNOSIS — I10 Essential (primary) hypertension: Secondary | ICD-10-CM | POA: Diagnosis not present

## 2015-05-29 ENCOUNTER — Encounter: Payer: Self-pay | Admitting: Pulmonary Disease

## 2015-06-01 ENCOUNTER — Ambulatory Visit (INDEPENDENT_AMBULATORY_CARE_PROVIDER_SITE_OTHER): Payer: Commercial Managed Care - HMO | Admitting: Pulmonary Disease

## 2015-06-01 ENCOUNTER — Encounter: Payer: Self-pay | Admitting: Pulmonary Disease

## 2015-06-01 ENCOUNTER — Other Ambulatory Visit (INDEPENDENT_AMBULATORY_CARE_PROVIDER_SITE_OTHER): Payer: Commercial Managed Care - HMO

## 2015-06-01 VITALS — BP 122/64 | HR 95 | Ht 65.0 in | Wt 130.6 lb

## 2015-06-01 DIAGNOSIS — J84112 Idiopathic pulmonary fibrosis: Secondary | ICD-10-CM

## 2015-06-01 LAB — BASIC METABOLIC PANEL
BUN: 10 mg/dL (ref 6–23)
CALCIUM: 10 mg/dL (ref 8.4–10.5)
CO2: 29 mEq/L (ref 19–32)
Chloride: 102 mEq/L (ref 96–112)
Creatinine, Ser: 0.69 mg/dL (ref 0.40–1.20)
GFR: 88.43 mL/min (ref >60.00)
Glucose, Bld: 100 mg/dL — ABNORMAL HIGH (ref 70–99)
Potassium: 4.4 mEq/L (ref 3.5–5.1)
Sodium: 140 mEq/L (ref 135–145)

## 2015-06-01 LAB — HEPATIC FUNCTION PANEL
ALBUMIN: 4.2 g/dL (ref 3.5–5.2)
ALT: 19 U/L (ref 0–35)
AST: 21 U/L (ref 0–37)
Alkaline Phosphatase: 126 U/L — ABNORMAL HIGH (ref 39–117)
BILIRUBIN TOTAL: 0.4 mg/dL (ref 0.2–1.2)
Bilirubin, Direct: 0.1 mg/dL (ref 0.0–0.3)
Total Protein: 7.6 g/dL (ref 6.0–8.3)

## 2015-06-01 MED ORDER — BUDESONIDE-FORMOTEROL FUMARATE 160-4.5 MCG/ACT IN AERO: 2.0000 | INHALATION_SPRAY | Freq: Two times a day (BID) | RESPIRATORY_TRACT | Status: DC

## 2015-06-01 NOTE — Assessment & Plan Note (Signed)
Today we discussed her CT scan and reviewed the images together in clinic which show basilar honeycombing and Inter lobular septal thickening consistent with a diagnosis of usual interstitial pneumonitis.   Her this is a progressive condition which has no cure. I also explained the nature of anti-fibrotic medications and how they only slow the progression of disease, did not make her feel better, and do have a high risk of GI side effect dermatologic side effect and other side effects including liver toxicity. She and her sister voiced understanding.  Plan: After lengthy conversation they decided that she would like to start taking Esbriet because she has a history of coronary artery disease we will avoid Ofev We will start financial assistance application Monthly LFT monitoring Check kidney function today Follow-up 2 months or sooner if needed  > 25 minutes spent in clinic with her today

## 2015-06-01 NOTE — Progress Notes (Signed)
Subjective:    Patient ID: [name], female    DOB: 1941/05/13, 74 y.o.   MRN: [id number]  Synopsis: Former [facility] patient with fibrosis PFT"s 2009:  No obstruction, TLC 3.23 (70%), DLCO 9.4 (50%) CT chest 08/2014:  Volume loss left hemithorax, fibrotic changes L > R, subpleural reticulation with a few areas of honeycombing.  (progressive by CXR from 2009) Episode of FPARDS 2004, +exposure to macrodantin (but not on chronically) PFT"s 2016:  No obstruction, TLC 2.47 (50%), DLCO 7.90 (34%) Autoimmune labwork 10/2014:  Negative Barium swallow 2016:  +esophageal dysmotility and +aspiration noted. >> referred to speech.  04/2015 Modified barium swallow: no clear aspiration 04/2015 CT chest (HRCT) > worsening pulmonary fibrosis changes in bases L>R consistent with UIP ([place] read)  HPI Chief Complaint  Patient presents with  . Follow-up    Pt c/o stable dyspnea. Pt states that she feels that hot weather is making breathing harder. Currently on 2L O2   She says things haven't changed much since the last visit. Not coughing much. On same dose of oxygen as last time.  The heat sometimes makes her cough a lot. No problems with swallowing.  Her oxygen is still on 2L and she continues to benefit from it.  Past Medical History  Diagnosis Date  . Hyperlipidemia   . Allergic rhinitis   . GERD (gastroesophageal reflux disease)   . COPD (chronic obstructive pulmonary disease)   . Intrinsic sphincter deficiency   . Arteriosclerotic heart disease       Review of Systems  Constitutional: Negative for fever, diaphoresis and fatigue.  HENT: Negative for postnasal drip, rhinorrhea and sinus pressure.   Respiratory: Positive for cough and shortness of breath. Negative for wheezing.   Cardiovascular: Negative for chest pain, palpitations and leg swelling.       Objective:   Physical Exam Filed Vitals:   06/01/15 1358  BP: 122/64  Pulse: 95  Height: 5\' 5"  (1.651 m)  Weight: 130 lb 9.6  oz (59.24 kg)  SpO2: 92%   2L   Gen: chronically ill appearing HENT: OP clear, TM's clear, neck supple PULM: Crackles 1/2 way up bilaterally, normal effort CV: RRR, no mgr, trace edema GI: BS+, soft, nontender Derm: no cyanosis or rash Psyche: normal mood and affect         Assessment & Plan:  UIP (usual interstitial pneumonitis) Today we discussed her CT scan and reviewed the images together in clinic which show basilar honeycombing and Inter lobular septal thickening consistent with a diagnosis of usual interstitial pneumonitis.   Her this is a progressive condition which has no cure. I also explained the nature of anti-fibrotic medications and how they only slow the progression of disease, did not make her feel better, and do have a high risk of GI side effect dermatologic side effect and other side effects including liver toxicity. She and her sister voiced understanding.  Plan: After lengthy conversation they decided that she would like to start taking Esbriet because she has a history of coronary artery disease we will avoid Ofev We will start financial assistance application Monthly LFT monitoring Check kidney function today Follow-up 2 months or sooner if needed  > 25 minutes spent in clinic with her today     Current outpatient prescriptions:  .  aspirin 81 MG tablet, Take 81 mg by mouth daily., Disp: , Rfl:  .  budesonide-formoterol (SYMBICORT) 160-4.5 MCG/ACT inhaler, Inhale 2 puffs into the lungs 2 (two) times daily.,  Disp: 1 Inhaler, Rfl: 1 .  desmopressin (DDAVP) 0.2 MG tablet, , Disp: , Rfl: 0 .  Fish Oil-Cholecalciferol (FISH OIL + D3 PO), Take by mouth daily. , Disp: , Rfl:  .  glucosamine-chondroitin 500-400 MG tablet, Take 1 tablet by mouth daily. , Disp: , Rfl:  .  meclizine (ANTIVERT) 12.5 MG tablet, Take 12.5 mg by mouth 3 (three) times daily as needed for dizziness., Disp: , Rfl:  .  Melatonin 3 MG CAPS, Take 1 capsule by mouth as needed. , Disp: ,  Rfl:  .  metoprolol succinate (TOPROL-XL) 50 MG 24 hr tablet, Take 50 mg by mouth daily. Take with or immediately following a meal., Disp: , Rfl:  .  mirtazapine (REMERON) 15 MG tablet, Take 15 mg by mouth at bedtime., Disp: , Rfl:  .  Multiple Vitamin (MULTIVITAMIN) tablet, Take 1 tablet by mouth daily., Disp: , Rfl:  .  MYRBETRIQ 50 MG TB24 tablet, , Disp: , Rfl: 1 .  omeprazole (PRILOSEC) 20 MG capsule, Take 20 mg by mouth daily., Disp: , Rfl:  .  simvastatin (ZOCOR) 40 MG tablet, Take 40 mg by mouth daily., Disp: , Rfl:  .  traMADol (ULTRAM) 50 MG tablet, 3 (three) times daily. For cough by Dr. Garner Nash, Disp: , Rfl: 0

## 2015-06-01 NOTE — Patient Instructions (Signed)
We are going to start you on a medication called Esbriet to slow the progression of your pulmonary fibrosis We will draw your blood today and again every month for the first 3 months to monitor your liver function Keep using her oxygen as you're doing We will see you back in 2 months or sooner if needed

## 2015-06-05 DIAGNOSIS — R05 Cough: Secondary | ICD-10-CM | POA: Diagnosis not present

## 2015-06-05 DIAGNOSIS — I10 Essential (primary) hypertension: Secondary | ICD-10-CM | POA: Diagnosis not present

## 2015-06-05 DIAGNOSIS — J8410 Pulmonary fibrosis, unspecified: Secondary | ICD-10-CM | POA: Diagnosis not present

## 2015-06-08 ENCOUNTER — Telehealth: Payer: Self-pay | Admitting: Pulmonary Disease

## 2015-06-08 NOTE — Progress Notes (Signed)
Quick Note:  Please see phone note from 8.26.16. Pt aware of results. Will sign off. ______

## 2015-06-08 NOTE — Telephone Encounter (Signed)
Spoke to pt and informed of lab results per Dr Kendrick Fries.

## 2015-06-13 ENCOUNTER — Telehealth: Payer: Self-pay | Admitting: Pulmonary Disease

## 2015-06-13 NOTE — Telephone Encounter (Signed)
I have re faxed form with correct DOB to fax #.  Called and spoke with Elease Hashimoto and made aware I have re faxed this over. Nothing further needed Pt case # [id number]

## 2015-06-15 ENCOUNTER — Encounter: Payer: Self-pay | Admitting: Pulmonary Disease

## 2015-06-22 ENCOUNTER — Encounter: Payer: Self-pay | Admitting: Pulmonary Disease

## 2015-06-22 NOTE — Telephone Encounter (Addendum)
Will send to Tammy as BQ is unavailable this afternoon.   Tammpy please advise. Thanks.

## 2015-06-22 NOTE — Telephone Encounter (Signed)
I called and spoke with the pt  She reports having increased cough for the past wk  She is not able to cough up any sputum  She denies any f/c/s, increased SOB, wheezing, CP or other co's  She is taking tramadol and this does not help calm the cough  Please advise recs thanks! Allergies  Allergen Reactions  . Codeine   . Penicillins   . Sulfonamide Derivatives

## 2015-06-24 DIAGNOSIS — I2510 Atherosclerotic heart disease of native coronary artery without angina pectoris: Secondary | ICD-10-CM | POA: Diagnosis not present

## 2015-07-03 ENCOUNTER — Encounter: Payer: Self-pay | Admitting: Pulmonary Disease

## 2015-07-04 NOTE — Telephone Encounter (Signed)
Per email: the Esbriet foundation called to check on me and I told them this week I was trieder than usual they said now that im on 6 pills a day that my dr might wont me to stay on this dose for a few weeks before I go on 9 a day which I start sat. please let me know what I need too do. thank you ---  Please advise Dr. Kendrick Fries thanks

## 2015-07-04 NOTE — Telephone Encounter (Signed)
Pt sent in pt email stating the below: dry cough nose running a little congestion Wants to know what else can take? Please advise thanks  Allergies  Allergen Reactions  . Codeine   . Penicillins   . Sulfonamide Derivatives

## 2015-07-05 ENCOUNTER — Telehealth: Payer: Self-pay | Admitting: Pulmonary Disease

## 2015-07-05 DIAGNOSIS — J202 Acute bronchitis due to streptococcus: Secondary | ICD-10-CM | POA: Diagnosis not present

## 2015-07-05 DIAGNOSIS — J849 Interstitial pulmonary disease, unspecified: Secondary | ICD-10-CM | POA: Diagnosis not present

## 2015-07-05 NOTE — Telephone Encounter (Signed)
Spoke with [employer] PA line, pt needed PA for Esbriet. PA was performed over the phone.  Reference # [id number].  PA will take 24-72 hours for a verdict, will receive this verdict via fax.  Verified office fax # on file.  Will route to my inbasket to follow up on.

## 2015-07-06 DIAGNOSIS — J8410 Pulmonary fibrosis, unspecified: Secondary | ICD-10-CM | POA: Diagnosis not present

## 2015-07-06 DIAGNOSIS — I10 Essential (primary) hypertension: Secondary | ICD-10-CM | POA: Diagnosis not present

## 2015-07-06 DIAGNOSIS — R05 Cough: Secondary | ICD-10-CM | POA: Diagnosis not present

## 2015-07-09 ENCOUNTER — Telehealth: Payer: Self-pay | Admitting: Pulmonary Disease

## 2015-07-09 NOTE — Telephone Encounter (Signed)
Called and spoke to pt's daughter. Informed her of the recs per BQ. Pt's daughter verbalized understanding and denied any further questions or concerns at this time.

## 2015-07-09 NOTE — Telephone Encounter (Signed)
Called and spoke to pt's daughter. Pt c/o vomiting, improving diarrhea, headache, weakness, decreased appetite, prod cough (unsure of mucus color) x 4-5 days. Pt stated she is taking Esbriet 2 tabs TID and feels her s/s are the result of taking Esbriet. Pt has been taking Esbriet for 3 weeks now. Pt states her SOB is at baseline.   Dr. Kendrick Fries please advise. Thanks.

## 2015-07-09 NOTE — Telephone Encounter (Signed)
PA has been approved.  Nothing further needed.

## 2015-07-09 NOTE — Telephone Encounter (Signed)
Yes, this is likely due to the Esbriet. Tell her to back down to 1 pill tid and eat bland foods, BRAT diet.  Then increase back up to two pills tid in 2 weeks

## 2015-07-13 DIAGNOSIS — E785 Hyperlipidemia, unspecified: Secondary | ICD-10-CM | POA: Diagnosis not present

## 2015-07-13 DIAGNOSIS — J84112 Idiopathic pulmonary fibrosis: Secondary | ICD-10-CM | POA: Diagnosis not present

## 2015-07-13 DIAGNOSIS — I2510 Atherosclerotic heart disease of native coronary artery without angina pectoris: Secondary | ICD-10-CM | POA: Diagnosis not present

## 2015-07-13 DIAGNOSIS — K219 Gastro-esophageal reflux disease without esophagitis: Secondary | ICD-10-CM | POA: Diagnosis not present

## 2015-07-13 DIAGNOSIS — Z885 Allergy status to narcotic agent status: Secondary | ICD-10-CM | POA: Diagnosis not present

## 2015-07-13 DIAGNOSIS — R109 Unspecified abdominal pain: Secondary | ICD-10-CM | POA: Diagnosis not present

## 2015-07-13 DIAGNOSIS — K5730 Diverticulosis of large intestine without perforation or abscess without bleeding: Secondary | ICD-10-CM | POA: Diagnosis not present

## 2015-07-13 DIAGNOSIS — F329 Major depressive disorder, single episode, unspecified: Secondary | ICD-10-CM | POA: Diagnosis not present

## 2015-07-13 DIAGNOSIS — Z880 Allergy status to penicillin: Secondary | ICD-10-CM | POA: Diagnosis not present

## 2015-07-13 DIAGNOSIS — R05 Cough: Secondary | ICD-10-CM | POA: Diagnosis not present

## 2015-07-14 DIAGNOSIS — Z880 Allergy status to penicillin: Secondary | ICD-10-CM | POA: Diagnosis not present

## 2015-07-14 DIAGNOSIS — F329 Major depressive disorder, single episode, unspecified: Secondary | ICD-10-CM | POA: Diagnosis not present

## 2015-07-14 DIAGNOSIS — K219 Gastro-esophageal reflux disease without esophagitis: Secondary | ICD-10-CM | POA: Diagnosis not present

## 2015-07-14 DIAGNOSIS — R05 Cough: Secondary | ICD-10-CM | POA: Diagnosis not present

## 2015-07-14 DIAGNOSIS — I2510 Atherosclerotic heart disease of native coronary artery without angina pectoris: Secondary | ICD-10-CM | POA: Diagnosis not present

## 2015-07-14 DIAGNOSIS — E785 Hyperlipidemia, unspecified: Secondary | ICD-10-CM | POA: Diagnosis not present

## 2015-07-14 DIAGNOSIS — R109 Unspecified abdominal pain: Secondary | ICD-10-CM | POA: Diagnosis not present

## 2015-07-14 DIAGNOSIS — J84112 Idiopathic pulmonary fibrosis: Secondary | ICD-10-CM | POA: Diagnosis not present

## 2015-07-14 DIAGNOSIS — Z885 Allergy status to narcotic agent status: Secondary | ICD-10-CM | POA: Diagnosis not present

## 2015-07-15 DIAGNOSIS — E785 Hyperlipidemia, unspecified: Secondary | ICD-10-CM | POA: Diagnosis not present

## 2015-07-15 DIAGNOSIS — J84112 Idiopathic pulmonary fibrosis: Secondary | ICD-10-CM | POA: Diagnosis not present

## 2015-07-15 DIAGNOSIS — K219 Gastro-esophageal reflux disease without esophagitis: Secondary | ICD-10-CM | POA: Diagnosis not present

## 2015-07-15 DIAGNOSIS — R05 Cough: Secondary | ICD-10-CM | POA: Diagnosis not present

## 2015-07-15 DIAGNOSIS — Z880 Allergy status to penicillin: Secondary | ICD-10-CM | POA: Diagnosis not present

## 2015-07-15 DIAGNOSIS — F329 Major depressive disorder, single episode, unspecified: Secondary | ICD-10-CM | POA: Diagnosis not present

## 2015-07-15 DIAGNOSIS — R109 Unspecified abdominal pain: Secondary | ICD-10-CM | POA: Diagnosis not present

## 2015-07-15 DIAGNOSIS — I2510 Atherosclerotic heart disease of native coronary artery without angina pectoris: Secondary | ICD-10-CM | POA: Diagnosis not present

## 2015-07-15 DIAGNOSIS — Z885 Allergy status to narcotic agent status: Secondary | ICD-10-CM | POA: Diagnosis not present

## 2015-07-20 ENCOUNTER — Inpatient Hospital Stay: Admit: 2015-07-20 | Payer: Self-pay

## 2015-07-20 ENCOUNTER — Other Ambulatory Visit (HOSPITAL_COMMUNITY)
Admission: RE | Admit: 2015-07-20 | Discharge: 2015-07-20 | Disposition: A | Discharge status: Home / Self Care | Payer: Commercial Managed Care - HMO | Source: Ambulatory Visit | Attending: Pulmonary Disease | Admitting: Pulmonary Disease

## 2015-07-20 ENCOUNTER — Telehealth: Payer: Self-pay | Admitting: Pulmonary Disease

## 2015-07-20 DIAGNOSIS — J84112 Idiopathic pulmonary fibrosis: Secondary | ICD-10-CM | POA: Diagnosis not present

## 2015-07-20 LAB — HEPATIC FUNCTION PANEL
ALT: 18 U/L (ref 14–54)
AST: 22 U/L (ref 15–41)
Albumin: 4.1 g/dL (ref 3.5–5.0)
Alkaline Phosphatase: 97 U/L (ref 38–126)
TOTAL PROTEIN: 7.6 g/dL (ref 6.5–8.1)
Total Bilirubin: 0.6 mg/dL (ref 0.3–1.2)

## 2015-07-20 NOTE — Telephone Encounter (Signed)
Received call from [facility] lab - pt there now for labs Pt does take Esbriet Nothing in epic stating she is due for labs Discussed with Morrie Sheldon > last LFT was 05/2015, pt is due for another Order placed and faxed to [facility] lab at [phone]  Nothing further needed; will sign off.

## 2015-07-24 DIAGNOSIS — J449 Chronic obstructive pulmonary disease, unspecified: Secondary | ICD-10-CM | POA: Diagnosis not present

## 2015-07-30 ENCOUNTER — Encounter: Payer: Self-pay | Admitting: Pulmonary Disease

## 2015-08-02 ENCOUNTER — Ambulatory Visit: Payer: Commercial Managed Care - HMO | Admitting: Pulmonary Disease

## 2015-08-05 DIAGNOSIS — R05 Cough: Secondary | ICD-10-CM | POA: Diagnosis not present

## 2015-08-05 DIAGNOSIS — I10 Essential (primary) hypertension: Secondary | ICD-10-CM | POA: Diagnosis not present

## 2015-08-05 DIAGNOSIS — J8410 Pulmonary fibrosis, unspecified: Secondary | ICD-10-CM | POA: Diagnosis not present

## 2015-08-14 ENCOUNTER — Ambulatory Visit (INDEPENDENT_AMBULATORY_CARE_PROVIDER_SITE_OTHER): Payer: Commercial Managed Care - HMO | Admitting: Pulmonary Disease

## 2015-08-14 ENCOUNTER — Encounter: Payer: Self-pay | Admitting: Pulmonary Disease

## 2015-08-14 VITALS — BP 126/70 | HR 87 | Ht 65.0 in | Wt 129.0 lb

## 2015-08-14 DIAGNOSIS — J849 Interstitial pulmonary disease, unspecified: Secondary | ICD-10-CM | POA: Diagnosis not present

## 2015-08-14 DIAGNOSIS — R1110 Vomiting, unspecified: Secondary | ICD-10-CM | POA: Diagnosis not present

## 2015-08-14 DIAGNOSIS — J84112 Idiopathic pulmonary fibrosis: Secondary | ICD-10-CM

## 2015-08-14 NOTE — Assessment & Plan Note (Signed)
Mrs. [name] had a very severe reaction to Esbriet. Specifically, she had severe nausea and vomiting which led to profound volume depletion requiring a 2 day hospital visit with IV fluid resuscitation. Because of the timing of the symptoms I believe that this was related to the Esbriet. At this time I think it's best to stay off the Esbriet for now.  She understands that UIP (IPF) is a progressive condition and I would like to try to treat her with an anti-fibrotic therapy if possible. However, we are stuck because she had such a severe reaction to Esbriet and I am reluctant to use Ofev because of her coronary disease. It is not clear to me if she would benefit from a lower dose of the Ofev.  Plan: Stay off of Esbriet for now Continue oxygen as written Follow-up with our interstitial lung disease specialist Dr. Marchelle Gearingamaswamy for a second opinion on anti-fibrotic therapy Follow-up with me after she sees Dr. Marchelle Gearingamaswamy

## 2015-08-14 NOTE — Patient Instructions (Signed)
Exercise regularly Use your oxygen as you're doing We will have you come back in January and see Dr. Marchelle Gearingamaswamy, I will see you after that

## 2015-08-14 NOTE — Progress Notes (Signed)
Subjective:    Patient ID: [name] Hansen, female    DOB: 19-Apr-1941, 74 y.o.   MRN: [id number]  Synopsis: Former [place] patient with fibrosis PFT"s 2009:  No obstruction, TLC 3.23 (70%), DLCO 9.4 (50%) CT chest 08/2014:  Volume loss left hemithorax, fibrotic changes L > R, subpleural reticulation with a few areas of honeycombing.  (progressive by CXR from 2009) Episode of FPARDS 2004, +exposure to macrodantin (but not on chronically) PFT"s 2016:  No obstruction, TLC 2.47 (50%), DLCO 7.90 (34%) Autoimmune labwork 10/2014:  Negative Barium swallow 2016:  +esophageal dysmotility and +aspiration noted. >> referred to speech.  04/2015 Modified barium swallow: no clear aspiration 04/2015 CT chest (HRCT) > worsening pulmonary fibrosis changes in bases L>R consistent with UIP (Entrikin read)  HPI Chief Complaint  Patient presents with  . Follow-up    pt did not tolerate esbriet, quit meds X3 weeks ago.  pt states she is at baseline today.    [name] Hansen ended up in the hospital after she started the Esbriet.  She was throwing up a lot and couldn't keep anything down.  She took 1 tid and felt a little sick, but when she took 2 tid she ran into a lot of tboule.  She had some diarrhea but minimal.  She was hospitalized for two day and received IVF, no other antibiotics.  Hasn't taken her medications since then. She says she is feeling much better, her breathing is okay. Her appetite has returned and she no longer has nausea or vomiting. She has no diarrhea.  Past Medical History  Diagnosis Date  . Hyperlipidemia   . Allergic rhinitis   . GERD (gastroesophageal reflux disease)   . COPD (chronic obstructive pulmonary disease) (HCC)   . Intrinsic sphincter deficiency   . Arteriosclerotic heart disease       Review of Systems  Constitutional: Negative for fever, diaphoresis and fatigue.  HENT: Negative for postnasal drip, rhinorrhea and sinus pressure.   Respiratory: Positive for cough and shortness  of breath. Negative for wheezing.   Cardiovascular: Negative for chest pain, palpitations and leg swelling.       Objective:   Physical Exam Filed Vitals:   08/14/15 1604  BP: 126/70  Pulse: 87  Height:  (1.651 m)  Weight: 129 lb (58.514 kg)  SpO2: 94%   2L   Gen: chronically ill appearing HENT: OP clear, TM's clear, neck supple PULM: Crackles 1/2 way up bilaterally, normal effort CV: RRR, no mgr, trace edema GI: BS+, soft, nontender Derm: no cyanosis or rash Psyche: normal mood and affect         Assessment & Plan:  UIP (usual interstitial pneumonitis) [name] Hansen had a very severe reaction to Esbriet. Specifically, she had severe nausea and vomiting which led to profound volume depletion requiring a 2 day hospital visit with IV fluid resuscitation. Because of the timing of the symptoms I believe that this was related to the Esbriet. At this time I think it's best to stay off the Esbriet for now.  She understands that UIP (IPF) is a progressive condition and I would like to try to treat her with an anti-fibrotic therapy if possible. However, we are stuck because she had such a severe reaction to Esbriet and I am reluctant to use Ofev because of her coronary disease. It is not clear to me if she would benefit from a lower dose of the Ofev.  Plan: Stay off of Esbriet for now Continue oxygen as  written Follow-up with our interstitial lung disease specialist Dr. Marchelle Gearingamaswamy for a second opinion on anti-fibrotic therapy Follow-up with me after she sees Dr. Marchelle Gearingamaswamy     Current outpatient prescriptions:  .  aspirin 81 MG tablet, Take 81 mg by mouth daily., Disp: , Rfl:  .  budesonide-formoterol (SYMBICORT) 160-4.5 MCG/ACT inhaler, Inhale 2 puffs into the lungs 2 (two) times daily., Disp: 1 Inhaler, Rfl: 1 .  desmopressin (DDAVP) 0.2 MG tablet, , Disp: , Rfl: 0 .  Fish Oil-Cholecalciferol (FISH OIL + D3 PO), Take by mouth daily. , Disp: , Rfl:  .  glucosamine-chondroitin  500-400 MG tablet, Take 1 tablet by mouth daily. , Disp: , Rfl:  .  meclizine (ANTIVERT) 12.5 MG tablet, Take 12.5 mg by mouth 3 (three) times daily as needed for dizziness., Disp: , Rfl:  .  Melatonin 3 MG CAPS, Take 1 capsule by mouth as needed. , Disp: , Rfl:  .  metoprolol succinate (TOPROL-XL) 50 MG 24 hr tablet, Take 50 mg by mouth daily. Take with or immediately following a meal., Disp: , Rfl:  .  mirtazapine (REMERON) 15 MG tablet, Take 15 mg by mouth at bedtime., Disp: , Rfl:  .  Multiple Vitamin (MULTIVITAMIN) tablet, Take 1 tablet by mouth daily., Disp: , Rfl:  .  MYRBETRIQ 50 MG TB24 tablet, , Disp: , Rfl: 1 .  omeprazole (PRILOSEC) 20 MG capsule, Take 20 mg by mouth daily., Disp: , Rfl:  .  simvastatin (ZOCOR) 40 MG tablet, Take 40 mg by mouth daily., Disp: , Rfl:  .  traMADol (ULTRAM) 50 MG tablet, 3 (three) times daily. For cough by Dr. Garner Nashaniels, Disp: , Rfl: 0

## 2015-08-24 DIAGNOSIS — I2510 Atherosclerotic heart disease of native coronary artery without angina pectoris: Secondary | ICD-10-CM | POA: Diagnosis not present

## 2015-08-24 DIAGNOSIS — I10 Essential (primary) hypertension: Secondary | ICD-10-CM | POA: Diagnosis not present

## 2015-09-04 DIAGNOSIS — J849 Interstitial pulmonary disease, unspecified: Secondary | ICD-10-CM | POA: Diagnosis not present

## 2015-09-04 DIAGNOSIS — R05 Cough: Secondary | ICD-10-CM | POA: Diagnosis not present

## 2015-09-05 DIAGNOSIS — I10 Essential (primary) hypertension: Secondary | ICD-10-CM | POA: Diagnosis not present

## 2015-09-05 DIAGNOSIS — R05 Cough: Secondary | ICD-10-CM | POA: Diagnosis not present

## 2015-09-05 DIAGNOSIS — J8410 Pulmonary fibrosis, unspecified: Secondary | ICD-10-CM | POA: Diagnosis not present

## 2015-09-23 DIAGNOSIS — I10 Essential (primary) hypertension: Secondary | ICD-10-CM | POA: Diagnosis not present

## 2015-09-23 DIAGNOSIS — I2510 Atherosclerotic heart disease of native coronary artery without angina pectoris: Secondary | ICD-10-CM | POA: Diagnosis not present

## 2015-10-03 DIAGNOSIS — N3090 Cystitis, unspecified without hematuria: Secondary | ICD-10-CM | POA: Diagnosis not present

## 2015-10-03 DIAGNOSIS — I10 Essential (primary) hypertension: Secondary | ICD-10-CM | POA: Diagnosis not present

## 2015-10-03 DIAGNOSIS — J449 Chronic obstructive pulmonary disease, unspecified: Secondary | ICD-10-CM | POA: Diagnosis not present

## 2015-10-03 DIAGNOSIS — I2510 Atherosclerotic heart disease of native coronary artery without angina pectoris: Secondary | ICD-10-CM | POA: Diagnosis not present

## 2015-10-03 DIAGNOSIS — E782 Mixed hyperlipidemia: Secondary | ICD-10-CM | POA: Diagnosis not present

## 2015-10-03 DIAGNOSIS — K210 Gastro-esophageal reflux disease with esophagitis: Secondary | ICD-10-CM | POA: Diagnosis not present

## 2015-10-05 DIAGNOSIS — R05 Cough: Secondary | ICD-10-CM | POA: Diagnosis not present

## 2015-10-05 DIAGNOSIS — I10 Essential (primary) hypertension: Secondary | ICD-10-CM | POA: Diagnosis not present

## 2015-10-05 DIAGNOSIS — J8410 Pulmonary fibrosis, unspecified: Secondary | ICD-10-CM | POA: Diagnosis not present

## 2015-10-11 DIAGNOSIS — J849 Interstitial pulmonary disease, unspecified: Secondary | ICD-10-CM | POA: Diagnosis not present

## 2015-10-11 DIAGNOSIS — K219 Gastro-esophageal reflux disease without esophagitis: Secondary | ICD-10-CM | POA: Diagnosis not present

## 2015-10-11 DIAGNOSIS — E782 Mixed hyperlipidemia: Secondary | ICD-10-CM | POA: Diagnosis not present

## 2015-10-11 DIAGNOSIS — I10 Essential (primary) hypertension: Secondary | ICD-10-CM | POA: Diagnosis not present

## 2015-10-11 DIAGNOSIS — Z0001 Encounter for general adult medical examination with abnormal findings: Secondary | ICD-10-CM | POA: Diagnosis not present

## 2015-10-11 DIAGNOSIS — J301 Allergic rhinitis due to pollen: Secondary | ICD-10-CM | POA: Diagnosis not present

## 2015-10-11 DIAGNOSIS — R300 Dysuria: Secondary | ICD-10-CM | POA: Diagnosis not present

## 2015-10-11 DIAGNOSIS — R110 Nausea: Secondary | ICD-10-CM | POA: Diagnosis not present

## 2015-10-11 DIAGNOSIS — I25111 Atherosclerotic heart disease of native coronary artery with angina pectoris with documented spasm: Secondary | ICD-10-CM | POA: Diagnosis not present

## 2015-10-11 DIAGNOSIS — Z1389 Encounter for screening for other disorder: Secondary | ICD-10-CM | POA: Diagnosis not present

## 2015-10-11 DIAGNOSIS — M1612 Unilateral primary osteoarthritis, left hip: Secondary | ICD-10-CM | POA: Diagnosis not present

## 2015-10-23 ENCOUNTER — Encounter: Payer: Self-pay | Admitting: Internal Medicine

## 2015-10-23 ENCOUNTER — Ambulatory Visit (INDEPENDENT_AMBULATORY_CARE_PROVIDER_SITE_OTHER): Payer: Commercial Managed Care - HMO | Admitting: Internal Medicine

## 2015-10-23 VITALS — BP 120/86 | HR 70 | Ht 65.0 in | Wt 130.4 lb

## 2015-10-23 DIAGNOSIS — I2510 Atherosclerotic heart disease of native coronary artery without angina pectoris: Secondary | ICD-10-CM | POA: Diagnosis not present

## 2015-10-23 DIAGNOSIS — J84112 Idiopathic pulmonary fibrosis: Secondary | ICD-10-CM | POA: Diagnosis not present

## 2015-10-23 NOTE — Progress Notes (Signed)
Subjective:     Patient ID: [name], female   DOB: [birth year], 75 y.o.   MRN: [id number]  HPI   Synopsis: Former [facility] patient with fibrosis PFT"s 2009:  No obstruction, TLC 3.23 (70%), DLCO 9.4 (50%) CT chest 08/2014:  Volume loss left hemithorax, fibrotic changes L > R, subpleural reticulation with a few areas of honeycombing.  (progressive by CXR from 2009) Episode of FPARDS 2004, +exposure to macrodantin (but not on chronically) PFT"s 2016:  No obstruction, TLC 2.47 (50%), DLCO 7.90 (34%) Autoimmune labwork 10/2014:  Negative Barium swallow 2016:  +esophageal dysmotility and +aspiration noted. >> referred to speech.  04/2015 Modified barium swallow: no clear aspiration 04/2015 CT chest (HRCT) > worsening pulmonary fibrosis changes in bases L>R consistent with UIP (Entrikin read)  HPI Chief Complaint  Patient presents with  . Follow-up    pt did not tolerate esbriet, quit meds X3 weeks ago.  pt states she is at baseline today.    [name] ended up in the hospital after she started the Esbriet.  She was throwing up a lot and couldn't keep anything down.  She took 1 tid and felt a little sick, but when she took 2 tid she ran into a lot of tboule.  She had some diarrhea but minimal.  She was hospitalized for two day and received IVF, no other antibiotics.  Hasn't taken her medications since then. She says she is feeling much better, her breathing is okay. Her appetite has returned and she no longer has nausea or vomiting. She has no diarrhea.   OV 10/23/2015  Chief Complaint  Patient presents with  . Follow-up    BQ pt here for second opinion. Pt c/o dry cough, pt states at times she will cough until emesis. Pt c/o DOE - at baseline. Pt denies CP/tightness.    Second opinion idiopathic primary fibrosis. Referred by Dr. Kendrick Fries. Specific question of starting nintedanib after having had severe GI side effects for Esbriet.  Review of the chart shows that she has high pretest probably  ready idiopathic peripheral fibrosis based on clinical grounds of classic UIP CT chest read by thoracic radiologist, negative autoimmune profile Jan 2016 and a negative history that I elicited today in the office. This severe D base of January 2016 PFT is moderate based on FVC 52% and DLCO 34%. However it has been one year since her last PFT.  She is here with her daughter. They report that in 2006 seen shortly after starting Esbriet she had severe nausea and vomiting that ended up in the hospital. In our entire office: This the first admission for Esbriet related GI side effects. She is now off Esbriet. She wants to consider nintedanib therapy and therefore she's been referred. In talking to her and her daughter they seem to have a decent understanding of IPF. They understood that it is a fibrotic lung disease. They understood that it is progressive. They understood that it is fatal. They understood that prognosis is few to several years. She denied any occupational exposures.  We also discussed therapy. She has specific questions about cardiac side affect profile off nintedanib. Review of the medication shows she is on DDAVP but she does not know this medication well. She also is on fish oil and she is unclear why. Her real concern with a cardiac side effect is because in 2004 she had massive heart attack for which she had a cardiac stent. Apparently after that she had "total body shutdown" and  underwent status post tracheostomy. She was then followed by cardiology for a while. Apparently since then she is only on aspirin. She's been discharged from cardiac follow-up. She does not have exertional chest pain. She only has mild exertional shortness of breath and mild occasional dry cough. She supposed to use oxygen with exertion but she seldom does that. She used to see Dr. Riley Kill cardiology but has been discharged from follow-up because she was doing well. At this point in time does not have orthopnea,  paroxysmal nocturnal dyspnea, hemoptysis, angina.  She also denies being on anticoagulation other than being on baby aspirin. She denies any bleeding episodes.  Review of the lab work   Autoimmune lab panel 10/25/2014 is negative including ANA, ACE, CCP, double-stranded DNA, rheumatoid factor, SSA, SSB, SCL-70   Pulmonary function test 10/25/2014: FVC 1.45/52%, FEV1 1.2 L/57%, ratio 82. Total lung capacity 2.5 L/50%. DLCO 7.9/34%.  CT chest classic UIP as reported by thoracic radiology July 2016   Current outpatient prescriptions:  .  aspirin 81 MG tablet, Take 81 mg by mouth daily., Disp: , Rfl:  .  budesonide-formoterol (SYMBICORT) 160-4.5 MCG/ACT inhaler, Inhale 2 puffs into the lungs 2 (two) times daily., Disp: 1 Inhaler, Rfl: 1 .  desmopressin (DDAVP) 0.2 MG tablet, , Disp: , Rfl: 0 .  Fish Oil-Cholecalciferol (FISH OIL + D3 PO), Take by mouth daily. , Disp: , Rfl:  .  glucosamine-chondroitin 500-400 MG tablet, Take 1 tablet by mouth daily. , Disp: , Rfl:  .  meclizine (ANTIVERT) 12.5 MG tablet, Take 12.5 mg by mouth 3 (three) times daily as needed for dizziness., Disp: , Rfl:  .  Melatonin 3 MG CAPS, Take 1 capsule by mouth as needed. , Disp: , Rfl:  .  metoprolol succinate (TOPROL-XL) 50 MG 24 hr tablet, Take 50 mg by mouth daily. Take with or immediately following a meal., Disp: , Rfl:  .  mirtazapine (REMERON) 15 MG tablet, Take 15 mg by mouth at bedtime., Disp: , Rfl:  .  Multiple Vitamin (MULTIVITAMIN) tablet, Take 1 tablet by mouth daily., Disp: , Rfl:  .  MYRBETRIQ 50 MG TB24 tablet, , Disp: , Rfl: 1 .  omeprazole (PRILOSEC) 20 MG capsule, Take 20 mg by mouth daily., Disp: , Rfl:  .  simvastatin (ZOCOR) 40 MG tablet, Take 40 mg by mouth daily., Disp: , Rfl:  .  traMADol (ULTRAM) 50 MG tablet, 3 (three) times daily. For cough by Dr. Garner Nash, Disp: , Rfl: 0   Review of Systems Per hpi    Objective:   Physical Exam  Constitutional: She is oriented to person, place, and  time. She appears well-developed and well-nourished. No distress.  HENT:  Head: Normocephalic and atraumatic.  Right Ear: External ear normal.  Left Ear: External ear normal.  Mouth/Throat: Oropharynx is clear and moist. No oropharyngeal exudate.  Scar of tracheostomy present  Eyes: Conjunctivae and EOM are normal. Pupils are equal, round, and reactive to light. Right eye exhibits no discharge. Left eye exhibits no discharge. No scleral icterus.  Neck: Normal range of motion. Neck supple. No JVD present. No tracheal deviation present. No thyromegaly present.  Cardiovascular: Normal rate, regular rhythm, normal heart sounds and intact distal pulses.  Exam reveals no gallop and no friction rub.   No murmur heard. Pulmonary/Chest: Effort normal. No respiratory distress. She has no wheezes. She has rales. She exhibits no tenderness.  Abdominal: Soft. Bowel sounds are normal. She exhibits no distension and no mass. There is  no tenderness. There is no rebound and no guarding.  Musculoskeletal: Normal range of motion. She exhibits no edema or tenderness.  Lymphadenopathy:    She has no cervical adenopathy.  Neurological: She is alert and oriented to person, place, and time. She has normal reflexes. No cranial nerve deficit. She exhibits normal muscle tone. Coordination normal.  Skin: Skin is warm and dry. No rash noted. She is not diaphoretic. No erythema. No pallor.  Psychiatric: She has a normal mood and affect. Her behavior is normal. Judgment and thought content normal.  Vitals reviewed.   Filed Vitals:   10/23/15 1524  BP: 120/86  Pulse: 70  Height: 5\' 5"  (1.651 m)  Weight: 130 lb 6.4 oz (59.149 kg)  SpO2: 98%        Assessment:       ICD-9-CM ICD-10-CM   1. IPF (idiopathic pulmonary fibrosis) (HCC) 516.31 J84.112   2. Coronary artery disease involving native coronary artery of native heart without angina pectoris 414.01 I25.10    Both drugs OFEV and Esbriet only slow down  progression, 1 out of 6 patients  - this means extension in quality of life but no difference in symptoms  - no study directly compares the 2 drugs but efficacy roughly equal at 1 year time point   - OFEV  - - time to first exacerbation possibly reduced in one trial but not in another - twice daily, no titration, potentially more convenient dosing  - no need for sunscreen  - high chance of mild diarrhea but low chance of significant diarrhea needing to stop medication.   - Rx diarrhea with lomotil - slight increase in heart attack risk of 0.5-1% in NEJM study and theoretical increase in bleeding risk, - at this point this is cuationaary  - need monthly blood work for 3 months and then every 6 months - monitor liver function   - Explain other issues - anecdotally I would say we have approximately 5 patients in in our cohort of 80 patient's with IPF will been intolerant to both Esbriet and nintedanib. Explained that it is impossible for me to predict side effect risk and tolerance with also just because she was significantly intolerant to nintedanib but she needs to be aware that nausea and vomiting are possible. She is on fish oil that can compound this GI side effect therefore she will have her come off this.  - Explain side effects are usually seen in the first 3 months  - Explain that for any procedure nintedanib will have to be held for 1-2 days   - Also regarding cardiac status: Good idea to have it reevaluated. We'll refer her for echo and cardiology evaluation at [facility] [place]     Plan:      Do full PFT at [facility] nintedanib 100 mg twice daily low dose and if tolerating well over 3 months with up to the higher dose Stop fish oil Please check with her primary care physician why you are on DDAVP?   Do complete echocardiogram at [facility] Refer cardiology clinic at [facility] [place]  Follow-up - 6 weeks with liver function test at follow-up  - Reported  back to see myself or Dr. Kendrick Fries or Rikki Spearing  - At follow-up also says the need for Symbicort inhaler   > 50% of this > 25 min visit spent in face to face counseling or coordination of care    Dr. Kalman Shan, M.D., [facility]C.P Pulmonary and Critical Care  Medicine Staff Physician [facility] Pager: [phone], If no answer or between  15:00h - 7:00h: call [phone]  10/23/2015 4:03 PM

## 2015-10-23 NOTE — Patient Instructions (Signed)
ICD-9-CM ICD-10-CM   1. IPF (idiopathic pulmonary fibrosis) (HCC) 516.31 J84.112   2. Coronary artery disease involving native coronary artery of native heart without angina pectoris 414.01 I25.10    [facility] Start nintedanib 100 mg twice daily low dose Stop fish oil Please check with her primary care physician why you are on DDAVP?  [facility] [place] [facility] [place]  Follow-up - 6 weeks with liver function test at follow-up  - Reported back to see myself or Dr. Kendrick FriesMcQuaid or Rikki Spearingammy Parrott  - At follow-up also says the need for Symbicort inhaler

## 2015-10-24 ENCOUNTER — Telehealth: Payer: Self-pay | Admitting: Internal Medicine

## 2015-10-24 NOTE — Telephone Encounter (Signed)
Faxed Ofev start form to [employer]. Will await PA form. Will hold in my box to follow.

## 2015-10-25 ENCOUNTER — Ambulatory Visit (HOSPITAL_COMMUNITY)
Admission: RE | Admit: 2015-10-25 | Discharge: 2015-10-25 | Disposition: A | Discharge status: Home / Self Care | Payer: Commercial Managed Care - HMO | Source: Ambulatory Visit | Attending: Internal Medicine | Admitting: Internal Medicine

## 2015-10-25 DIAGNOSIS — I2510 Atherosclerotic heart disease of native coronary artery without angina pectoris: Secondary | ICD-10-CM | POA: Insufficient documentation

## 2015-10-25 DIAGNOSIS — I10 Essential (primary) hypertension: Secondary | ICD-10-CM | POA: Insufficient documentation

## 2015-10-25 DIAGNOSIS — E785 Hyperlipidemia, unspecified: Secondary | ICD-10-CM | POA: Diagnosis not present

## 2015-10-26 ENCOUNTER — Encounter (HOSPITAL_COMMUNITY): Payer: Commercial Managed Care - HMO

## 2015-10-26 ENCOUNTER — Telehealth: Payer: Self-pay | Admitting: Internal Medicine

## 2015-10-26 NOTE — Telephone Encounter (Signed)
Pls let [name] know that other than mild pressure effect of the pulmonary fibrosis on heart - heart pump seems fine on echo. She should talk to cardiologist in detail.   Also, please list ESBRIET as allergy for her - severe GI side effects resulting in admission  2016

## 2015-10-26 NOTE — Telephone Encounter (Signed)
Spoke with [name] with [employer], states that PA form was faxed. Please advise [name] if you have received this? Its not in triage.  I have requested that [name] fax another just in case.

## 2015-10-26 NOTE — Telephone Encounter (Signed)
Yes I have received this and it has been completed and faxed this morning 10/26/2015. Will await approval.

## 2015-10-29 NOTE — Telephone Encounter (Signed)
I spoke with patient about results and she verbalized understanding and had no questions 

## 2015-10-31 ENCOUNTER — Encounter: Payer: Self-pay | Admitting: Cardiology

## 2015-10-31 ENCOUNTER — Encounter: Payer: Self-pay | Admitting: *Deleted

## 2015-10-31 ENCOUNTER — Ambulatory Visit (INDEPENDENT_AMBULATORY_CARE_PROVIDER_SITE_OTHER): Payer: Commercial Managed Care - HMO | Admitting: Cardiology

## 2015-10-31 VITALS — BP 134/78 | HR 81 | Ht 63.0 in | Wt 126.8 lb

## 2015-10-31 DIAGNOSIS — I10 Essential (primary) hypertension: Secondary | ICD-10-CM | POA: Diagnosis not present

## 2015-10-31 DIAGNOSIS — E785 Hyperlipidemia, unspecified: Secondary | ICD-10-CM

## 2015-10-31 DIAGNOSIS — J84112 Idiopathic pulmonary fibrosis: Secondary | ICD-10-CM | POA: Diagnosis not present

## 2015-10-31 DIAGNOSIS — I25810 Atherosclerosis of coronary artery bypass graft(s) without angina pectoris: Secondary | ICD-10-CM

## 2015-10-31 NOTE — Progress Notes (Signed)
Cardiology Office Note  Date: 10/31/2015   ID: NAVJOT PILGRIM, DOB October 19, 1940, MRN [id number]  PCP: Donzetta Sprung, MD  Consulting Cardiologist: Nona Dell, MD   Chief Complaint  Patient presents with  . Coronary Artery Disease    History of Present Illness: [name] is a 75 y.o. female referred for cardiology consultation by Dr. Marchelle Gearing. She is a former patient Dr. Andee Lineman, although not seen for several years. I reviewed her records and updated the chart. She is here with her daughter today. From a cardiac perspective, she reports doing well over the last several years, no active angina symptoms or nitroglycerin requirement. She has chronic dyspnea on exertion in association with IPF, and is undergoing medication adjustments through the Pulmonary division. We reviewed her medications, cardiac regimen includes aspirin, Toprol-XL, and Zocor.  She had a recent echocardiogram arranged by Dr. Marchelle Gearing, results are outlined below. We went over the results in detail today. She has normal LVEF with mild diastolic dysfunction, valvular calcification without significant stenotic or regurgitant lesions, and evidence of mild pulmonary hypertension likely related related to her IPF.  Last cardiac catheterization was in 2008. RCA stent was patent at that time, and she had otherwise mild nonobstructive atherosclerosis in the left coronary system. PASP was 27 mmHg at that time.  ECG today shows sinus rhythm with borderline low voltage in the precordial leads and nonspecific ST-T changes.  She follows regularly with Dr. Reuel Boom for primary care.  Past Medical History  Diagnosis Date  . Hyperlipidemia   . Allergic rhinitis   . GERD (gastroesophageal reflux disease)   . Idiopathic pulmonary fibrosis (HCC)     Followed by Dr. Marchelle Gearing  . CAD (coronary artery disease)     HepaCoat stent to proximal RCA 2004 with acute MI  . History of acute inferior wall MI     Associated with RV  infarct and cardiogenic shock 2004   . Essential hypertension     Past Surgical History  Procedure Laterality Date  . Gallbladder surgery    . Abdominal hysterectomy    . Arm surgery Left   . Bladder surgery      Current Outpatient Prescriptions  Medication Sig Dispense Refill  . aspirin 81 MG tablet Take 81 mg by mouth daily.    . metoprolol succinate (TOPROL-XL) 50 MG 24 hr tablet Take 50 mg by mouth daily. Take with or immediately following a meal.    . mirtazapine (REMERON) 15 MG tablet Take 15 mg by mouth at bedtime.    . Multiple Vitamin (MULTIVITAMIN) tablet Take 1 tablet by mouth daily.    Marland Kitchen omeprazole (PRILOSEC) 20 MG capsule Take 20 mg by mouth daily.    . simvastatin (ZOCOR) 40 MG tablet Take 40 mg by mouth daily.    . traMADol (ULTRAM) 50 MG tablet 3 (three) times daily. For cough by Dr. Garner Nash  0   No current facility-administered medications for this visit.   Allergies:  Other; Codeine; Penicillins; and Sulfonamide derivatives   Social History: The patient  reports that she quit smoking about 16 years ago. Her smoking use included Cigarettes. She has a 25 pack-year smoking history. She does not have any smokeless tobacco history on file. She reports that she does not drink alcohol or use illicit drugs.   Family History: The patient's family history includes Asthma in her mother; Colon cancer in her son; Stomach cancer in her sister.   ROS:  Please see the history of present  illness. Otherwise, complete review of systems is positive for intermittent dry cough.  All other systems are reviewed and negative.   Physical Exam: VS:  BP 134/78 mmHg  Pulse 81  Ht  (1.6 m)  Wt 126 lb 12.8 oz (57.516 kg)  BMI 22.47 kg/m2  LMP  (LMP Unknown), BMI Body mass index is 22.47 kg/(m^2).  Wt Readings from Last 3 Encounters:  10/31/15 126 lb 12.8 oz (57.516 kg)  10/23/15 130 lb 6.4 oz (59.149 kg)  08/14/15 129 lb (58.514 kg)    General: Patient appears comfortable at  rest. HEENT: Conjunctiva and lids normal, oropharynx clear. Neck: Supple, no elevated JVP or carotid bruits, no thyromegaly. Lungs: Coarse breath sounds without wheezing, nonlabored breathing at rest. Cardiac: Regular rate and rhythm, no S3 or significant systolic murmur, no pericardial rub. Abdomen: Soft, nontender, bowel sounds present, no guarding or rebound. Extremities: No pitting edema, distal pulses 2+. Skin: Warm and dry. Musculoskeletal: No kyphosis. Neuropsychiatric: Alert and oriented x3, affect grossly appropriate.  ECG: ECG is ordered today.  Recent Labwork: 06/01/2015: BUN 10; Creatinine, Ser 0.69; Potassium 4.4; Sodium 140 07/20/2015: ALT 18; AST 22   Other Studies Reviewed Today:  Cardiac catheterization 04/09/2007: HEMODYNAMIC DATA: 1. Right atrial pressure 5. 2. RV 28/5. 3. Pulmonary artery 27/7, mean 15. 4. Pulmonary capillary wedge 7. 5. LV 138/9. 6. Aortic 132/69, mean 95. 7. No gradient on pullback across the aortic valve. 8. Pulmonary artery saturation 58%. 9. Superior vena cava saturation 54%. 10.Aortic saturation 90%. 11.Fick cardiac output 2.5 liters per minute. 12.Fick cardiac index 1.5 liters per minute per meter squared. 13.Thermodilution cardiac output 3.8 liters per minute. 14.Thermodilution cardiac index 2.3 liters per minute per meter  squared.  ANGIOGRAPHIC DATA.: 1. Ventriculography was done in the RAO projection. There was perhaps  trace mitral regurgitation but this did not appear to be  significant. Overall, LV function was preserved with an ejection  fraction in excess of 50%. There was perhaps very mild inferobasal  hypokinesis, but improved from the acute study. The remainder of  the segmental wall motion was normal. 2. The right coronary artery is a large dominant vessel. It is  previously stented in the mid vessel by fluoroscopy. There is 20%  proximal narrowing within the  stent itself. There does not appear  to be any luminal narrowing more than 30%. The mid and distal  vessel has mild luminal irregularity but no critical stenoses. The  PDA bifurcates. The posterolateral branch is moderate in size.  There is no obvious critical disease in the right coronary  distribution. 3. The left main is free of critical disease. 4. The left anterior descending artery courses to the apex. On the  RAO cranial views, there is some mild irregularity in the LAD at  the ostium of about 30%. The proximal left anterior descending  artery, however, does not appear to be critical, and is seen in  both the LAO caudal and RAO cranial views. The mid vessel has an  additional area of 30% narrowing which again does not appear to be  flow-limiting. The distal LAD has mild luminal irregularity and  wraps the apex. 5. There is a ramus intermedius that has probably 30% to 40% proximal  narrowing and then opens up. There is a secondary lesion of about  30% as well. Critical stenosis is not noted, although the distal  vessel is fairly large in caliber compared to the proximal vessel  which I suspect is segmentally plaqued. 6.  The circumflex proper provides an insignificant marginal branch  followed by a large bifurcating marginal branch. Overlapping this  is about a 30-40% narrowing at the large branch bifurcation. The  more inferior branch has about 20-30% narrowing distally.  Echocardiogram 10/25/2015: Study Conclusions  - Left ventricle: The cavity size was normal. Wall thickness was normal. Systolic function was normal. The estimated ejection fraction was in the range of 55% to 60%. Wall motion was normal; there were no regional wall motion abnormalities. Doppler parameters are consistent with abnormal left ventricular relaxation (grade 1 diastolic dysfunction). - Aortic valve:  Mildly calcified annulus. Trileaflet; mildly thickened leaflets. Valve area (VTI): 1.73 cm^2. Valve area (Vmax): 1.78 cm^2. - Mitral valve: Mildly calcified annulus. Mildly thickened leaflets . - Atrial septum: No defect or patent foramen ovale was identified. - Pulmonary arteries: Systolic pressure was mildly to moderately increased. PA peak pressure: 41 mm Hg (S). - Systemic veins: IVC is small, suggesting low RA pressures and hypovolemia. - Technically adequate study.  Assessment and Plan:  1. Symptomatically stable CAD status post stent placement to the proximal RCA in 2004 in the setting of acute inferior wall infarct with RV infarct and cardiogenic shock. She has done well since that time. Most recent cardiac catheterization in 2008 showed patent stent site with otherwise mild left system coronary atherosclerosis. She has not had any interval ischemic testing since that point, and we will arrange a Lexiscan Cardiolite on medical therapy to reassess ischemic burden. Recent echocardiogram reviewed and shows normal LVEF with mild diastolic dysfunction.  2. Hyperlipidemia, on Zocor. She follows with Dr. Reuel Boom for routine lab work.  3. Idiopathic pulmonary fibrosis, followed by Dr. Marchelle Gearing. Recent pulmonary notes reviewed.  4. Essential hypertension, blood pressure control is reasonable today.  Current medicines were reviewed with the patient today.   Orders Placed This Encounter  Procedures  . NM Myocar Multi W/Spect W/Wall Motion / EF  . EKG 12-Lead    Disposition: FU with men 6 months.   Signed, Jonelle Sidle, MD, [facility] 10/31/2015 9:43 AM    [facility] [address] [place], [place] [address] Phone: [phone]; Fax: [phone]

## 2015-10-31 NOTE — Patient Instructions (Signed)
Your physician wants you to follow-up in: 6 months with Dr. Diona Browner.  You will receive a reminder letter in the mail two months in advance. If you don't receive a letter, please call our office to schedule the follow-up appointment.  Your physician recommends that you continue on your current medications as directed. Please refer to the Current Medication list given to you today.  Your physician has requested that you have a lexiscan myoview. For further information please visit [email or web address] Please follow instruction sheet, as given.  If you need a refill on your cardiac medications before your next appointment, please call your pharmacy.  Thank you for choosing [facility]!

## 2015-10-31 NOTE — Telephone Encounter (Signed)
Called and [facility] and spoke with Amy. She states the Ofev was approved on 10/27/15 and that they will be getting in contact with the patient for shipment details.   Called and spoke with the patient. I notified her of the above. She stated she would be looking for the call. Patient voiced understanding and had no further questions. Nothing further needed at this time.

## 2015-11-02 ENCOUNTER — Ambulatory Visit: Payer: Commercial Managed Care - HMO | Admitting: Cardiology

## 2015-11-05 DIAGNOSIS — I10 Essential (primary) hypertension: Secondary | ICD-10-CM | POA: Diagnosis not present

## 2015-11-05 DIAGNOSIS — R05 Cough: Secondary | ICD-10-CM | POA: Diagnosis not present

## 2015-11-05 DIAGNOSIS — J8410 Pulmonary fibrosis, unspecified: Secondary | ICD-10-CM | POA: Diagnosis not present

## 2015-11-07 ENCOUNTER — Encounter (HOSPITAL_COMMUNITY): Payer: Commercial Managed Care - HMO

## 2015-11-07 ENCOUNTER — Inpatient Hospital Stay (HOSPITAL_COMMUNITY): Admission: RE | Admit: 2015-11-07 | Payer: Commercial Managed Care - HMO | Source: Ambulatory Visit

## 2015-11-07 DIAGNOSIS — J209 Acute bronchitis, unspecified: Secondary | ICD-10-CM | POA: Diagnosis not present

## 2015-11-07 DIAGNOSIS — R05 Cough: Secondary | ICD-10-CM | POA: Diagnosis not present

## 2015-11-07 DIAGNOSIS — J029 Acute pharyngitis, unspecified: Secondary | ICD-10-CM | POA: Diagnosis not present

## 2015-11-09 DIAGNOSIS — Z1231 Encounter for screening mammogram for malignant neoplasm of breast: Secondary | ICD-10-CM | POA: Diagnosis not present

## 2015-11-16 ENCOUNTER — Encounter: Payer: Self-pay | Admitting: Pulmonary Disease

## 2015-11-20 DIAGNOSIS — N939 Abnormal uterine and vaginal bleeding, unspecified: Secondary | ICD-10-CM | POA: Diagnosis not present

## 2015-11-22 ENCOUNTER — Encounter: Payer: Self-pay | Admitting: Adult Health

## 2015-11-22 NOTE — Telephone Encounter (Signed)
Pt is requesting something for nausea to be called in since starting the OFEV. Please advise Dr. Kendrick Fries thanks

## 2015-11-24 ENCOUNTER — Encounter: Payer: Self-pay | Admitting: Adult Health

## 2015-11-27 DIAGNOSIS — R3915 Urgency of urination: Secondary | ICD-10-CM | POA: Diagnosis not present

## 2015-11-27 DIAGNOSIS — N939 Abnormal uterine and vaginal bleeding, unspecified: Secondary | ICD-10-CM | POA: Diagnosis not present

## 2015-11-28 MED ORDER — ONDANSETRON HCL 4 MG PO TABS: 4.0000 mg | ORAL_TABLET | Freq: Four times a day (QID) | ORAL | Status: DC | PRN

## 2015-11-28 NOTE — Telephone Encounter (Signed)
PerBQ: Message     Please send zofran  po q6h prn nausea/vomiting. Disp #45 refill 2        She needs to drink plenty of water and please go over the [employer]         I was not aware she was going to start on Ofev. This is fine but she needs to be very careful because she ended up in the hospital from complications from the Esbriet so stress the importance of keeping food down and to let us know if she is not getting better

## 2015-11-28 NOTE — Telephone Encounter (Signed)
Pt is aware of BQ's recommendations via telephone conversation. Rx has been sent in. Nothing further was needed.

## 2015-11-28 NOTE — Telephone Encounter (Signed)
Dr. Kendrick Fries pt sent email 2/13. She reports she is feeling very nauseated from OFEV. She wants something called in to help w/ this.  Please advise Dr. Kendrick Fries thanks

## 2015-11-29 ENCOUNTER — Telehealth: Payer: Self-pay | Admitting: Internal Medicine

## 2015-11-29 NOTE — Telephone Encounter (Signed)
Triage  She is having some nause after ofev.  Patient Dr Kendrick Fries and I share . Pls cal 11/30/15 and see if she is doing ok. I believe DR Kendrick Fries had sent in some zofran  THanks  Dr. Kalman Shan, M.D., [facility].C.P Pulmonary and Critical Care Medicine Staff Physician [facility] [facility] Pager: [phone], If no answer or between  15:00h - 7:00h: call [phone]  11/29/2015 6:08 PM

## 2015-11-30 NOTE — Telephone Encounter (Signed)
Spoke with pt. States that her nausea is doing much better since starting on Zofran. Has been trying to follow the BRAT diet that BQ recommended. Advised her that if she needs [place] for anything else to please let us know.

## 2015-12-04 ENCOUNTER — Encounter: Payer: Self-pay | Admitting: Adult Health

## 2015-12-04 ENCOUNTER — Ambulatory Visit (INDEPENDENT_AMBULATORY_CARE_PROVIDER_SITE_OTHER): Payer: Commercial Managed Care - HMO | Admitting: Adult Health

## 2015-12-04 ENCOUNTER — Ambulatory Visit (HOSPITAL_COMMUNITY)
Admission: RE | Admit: 2015-12-04 | Discharge: 2015-12-04 | Disposition: A | Discharge status: Home / Self Care | Payer: Commercial Managed Care - HMO | Source: Ambulatory Visit | Attending: Internal Medicine | Admitting: Internal Medicine

## 2015-12-04 ENCOUNTER — Other Ambulatory Visit (INDEPENDENT_AMBULATORY_CARE_PROVIDER_SITE_OTHER): Payer: Commercial Managed Care - HMO

## 2015-12-04 VITALS — BP 118/82 | HR 63 | Ht 65.0 in | Wt 128.4 lb

## 2015-12-04 DIAGNOSIS — J84112 Idiopathic pulmonary fibrosis: Secondary | ICD-10-CM | POA: Diagnosis not present

## 2015-12-04 DIAGNOSIS — Z23 Encounter for immunization: Secondary | ICD-10-CM

## 2015-12-04 DIAGNOSIS — J9611 Chronic respiratory failure with hypoxia: Secondary | ICD-10-CM | POA: Diagnosis not present

## 2015-12-04 LAB — PULMONARY FUNCTION TEST
DL/VA % pred: 46 %
DL/VA: 2.15 ml/min/mmHg/L
DLCO unc % pred: 13 %
DLCO unc: 3.08 ml/min/mmHg
FEF 25-75 PRE: 1.12 L/s
FEF 25-75 Post: 1.49 L/sec
FEF2575-%CHANGE-POST: 33 %
FEF2575-%Pred-Post: 91 %
FEF2575-%Pred-Pre: 68 %
FEV1-%CHANGE-POST: 3 %
FEV1-%PRED-POST: 55 %
FEV1-%PRED-PRE: 53 %
FEV1-PRE: 1.09 L
FEV1-Post: 1.13 L
FEV1FVC-%CHANGE-POST: 8 %
FEV1FVC-%Pred-Pre: 108 %
FEV6-%Change-Post: -4 %
FEV6-%PRED-POST: 49 %
FEV6-%Pred-Pre: 52 %
FEV6-PRE: 1.34 L
FEV6-Post: 1.28 L
FEV6FVC-%PRED-PRE: 105 %
FEV6FVC-%Pred-Post: 105 %
FVC-%CHANGE-POST: -4 %
FVC-%PRED-POST: 46 %
FVC-%PRED-PRE: 49 %
FVC-PRE: 1.34 L
FVC-Post: 1.28 L
POST FEV1/FVC RATIO: 88 %
PRE FEV6/FVC RATIO: 100 %
Post FEV6/FVC ratio: 100 %
Pre FEV1/FVC ratio: 82 %
RV % PRED: 51 %
RV: 1.15 L
TLC % pred: 47 %
TLC: 2.32 L

## 2015-12-04 LAB — HEPATIC FUNCTION PANEL
ALT: 17 U/L (ref 0–35)
AST: 22 U/L (ref 0–37)
Albumin: 4 g/dL (ref 3.5–5.2)
Alkaline Phosphatase: 103 U/L (ref 39–117)
Bilirubin, Direct: 0.1 mg/dL (ref 0.0–0.3)
TOTAL PROTEIN: 7.3 g/dL (ref 6.0–8.3)
Total Bilirubin: 0.3 mg/dL (ref 0.2–1.2)

## 2015-12-04 MED ORDER — ALBUTEROL SULFATE (2.5 MG/3ML) 0.083% IN NEBU
2.5000 mg | INHALATION_SOLUTION | Freq: Once | RESPIRATORY_TRACT | Status: AC
  Administered 2015-12-04: 2.5 mg via RESPIRATORY_TRACT

## 2015-12-04 NOTE — Assessment & Plan Note (Signed)
Cont on o2 .  

## 2015-12-04 NOTE — Patient Instructions (Addendum)
Continue on OFEV.  Labs today  Continue on Oxygen 2l/m .  follow up Dr. Marchelle Gearing in 2 months and As needed

## 2015-12-04 NOTE — Progress Notes (Signed)
Quick Note:  Called and spoke with pt. Reviewed results and recs. Pt voiced understanding and had no further questions. ______ 

## 2015-12-04 NOTE — Assessment & Plan Note (Signed)
Doing well on OFEV. PFT does show a decline in diffucing capacity.  Appears stable in activity tolerance.  Check LFT today.

## 2015-12-04 NOTE — Addendum Note (Signed)
Addended by: Vito Backers on: 12/04/2015 02:45 PM   Modules accepted: Orders

## 2015-12-04 NOTE — Progress Notes (Signed)
Subjective:     Patient ID: [name], female   DOB: [birth year], 75 y.o.   MRN: [id number]  HPI   Synopsis: Former [facility] patient with fibrosis PFT"s 2009:  No obstruction, TLC 3.23 (70%), DLCO 9.4 (50%) CT chest 08/2014:  Volume loss left hemithorax, fibrotic changes L > R, subpleural reticulation with a few areas of honeycombing.  (progressive by CXR from 2009) Episode of FPARDS 2004, +exposure to macrodantin (but not on chronically) PFT"s 2016:  No obstruction, TLC 2.47 (50%), DLCO 7.90 (34%) Autoimmune labwork 10/2014:  Negative Barium swallow 2016:  +esophageal dysmotility and +aspiration noted. >> referred to speech.  04/2015 Modified barium swallow: no clear aspiration 04/2015 CT chest (HRCT) > worsening pulmonary fibrosis changes in bases L>R consistent with UIP (Entrikin read)   OV 10/23/2015 IOV with Dr. Marchelle Gearing   Chief Complaint  Patient presents with  . Follow-up    BQ pt here for second opinion. Pt c/o dry cough, pt states at times she will cough until emesis. Pt c/o DOE - at baseline. Pt denies CP/tightness.    Second opinion idiopathic primary fibrosis. Referred by Dr. Kendrick Fries. Specific question of starting nintedanib after having had severe GI side effects for Esbriet.  Review of the chart shows that she has high pretest probably ready idiopathic peripheral fibrosis based on clinical grounds of classic UIP CT chest read by thoracic radiologist, negative autoimmune profile Jan 2016 and a negative history that I elicited today in the office. This severe D base of January 2016 PFT is moderate based on FVC 52% and DLCO 34%. However it has been one year since her last PFT.  She is here with her daughter. They report that in 2006 seen shortly after starting Esbriet she had severe nausea and vomiting that ended up in the hospital. In our entire office: This the first admission for Esbriet related GI side effects. She is now off Esbriet. She wants to consider nintedanib therapy  and therefore she's been referred. In talking to her and her daughter they seem to have a decent understanding of IPF. They understood that it is a fibrotic lung disease. They understood that it is progressive. They understood that it is fatal. They understood that prognosis is few to several years. She denied any occupational exposures.  We also discussed therapy. She has specific questions about cardiac side affect profile off nintedanib. Review of the medication shows she is on DDAVP but she does not know this medication well. She also is on fish oil and she is unclear why. Her real concern with a cardiac side effect is because in 2004 she had massive heart attack for which she had a cardiac stent. Apparently after that she had "total body shutdown" and underwent status post tracheostomy. She was then followed by cardiology for a while. Apparently since then she is only on aspirin. She's been discharged from cardiac follow-up. She does not have exertional chest pain. She only has mild exertional shortness of breath and mild occasional dry cough. She supposed to use oxygen with exertion but she seldom does that. She used to see Dr. Riley Kill cardiology but has been discharged from follow-up because she was doing well. At this point in time does not have orthopnea, paroxysmal nocturnal dyspnea, hemoptysis, angina.  She also denies being on anticoagulation other than being on baby aspirin. She denies any bleeding episodes.  Review of the lab work   Autoimmune lab panel 10/25/2014 is negative including ANA, ACE, CCP, double-stranded DNA, rheumatoid  factor, SSA, SSB, SCL-70   Pulmonary function test 10/25/2014: FVC 1.45/52%, FEV1 1.2 L/57%, ratio 82. Total lung capacity 2.5 L/50%. DLCO 7.9/34%.  CT chest classic UIP as reported by thoracic radiology July 2016   12/04/2015 Follow up : ILD  Pt returns for 6 week follow up .  Seen last ov for second opionion for ILD .  She was started on OFEV. Previously  on Esbriet but unable to tolerate.  Had follow up PFT today , it does show worsening Diffusing defect  34% to 13%.  FEV1 55%, ratio 88, FVC 46%.  Says she is tolerating OFEV well. Has only had 2 episodes of diarrhea that was self limiting.  No n/v. Feels okay on OFEV.  Pt c/o mostly dry cough. Pt denies wheeze/increased SOB/CP/tightness.  Gets winded with long walking and inclines.  Wears O2 2l/m .  Echo showed 55-60%, PAP . Gr 1 DD    Past Medical History  Diagnosis Date  . Hyperlipidemia   . Allergic rhinitis   . GERD (gastroesophageal reflux disease)   . Idiopathic pulmonary fibrosis (HCC)     Followed by Dr. Marchelle Gearing  . CAD (coronary artery disease)     HepaCoat stent to proximal RCA 2004 with acute MI  . History of acute inferior wall MI     Associated with RV infarct and cardiogenic shock 2004   . Essential hypertension    Current Outpatient Prescriptions on File Prior to Visit  Medication Sig Dispense Refill  . aspirin 81 MG tablet Take 81 mg by mouth daily.    . metoprolol succinate (TOPROL-XL) 50 MG 24 hr tablet Take 50 mg by mouth daily. Take with or immediately following a meal.    . mirtazapine (REMERON) 15 MG tablet Take 15 mg by mouth at bedtime.    . Multiple Vitamin (MULTIVITAMIN) tablet Take 1 tablet by mouth daily.    Marland Kitchen omeprazole (PRILOSEC) 20 MG capsule Take 20 mg by mouth daily.    . ondansetron (ZOFRAN) 4 MG tablet Take 1 tablet (4 mg total) by mouth every 6 (six) hours as needed for nausea or vomiting. 45 tablet 2  . simvastatin (ZOCOR) 40 MG tablet Take 40 mg by mouth daily.    . traMADol (ULTRAM) 50 MG tablet 3 (three) times daily. For cough by Dr. Garner Nash  0   No current facility-administered medications on file prior to visit.     Review of Systems Constitutional:   No  weight loss, night sweats,  Fevers, chills, fatigue, or  lassitude.  HEENT:   No headaches,  Difficulty swallowing,  Tooth/dental problems, or  Sore throat,                 No sneezing, itching, ear ache, nasal congestion, post nasal drip,   CV:  No chest pain,  Orthopnea, PND, swelling in lower extremities, anasarca, dizziness, palpitations, syncope.   GI  No heartburn, indigestion, abdominal pain, nausea, vomiting, diarrhea, change in bowel habits, loss of appetite, bloody stools.   Resp:  No chest wall deformity  Skin: no rash or lesions.  GU: no dysuria, change in color of urine, no urgency or frequency.  No flank pain, no hematuria   MS:  No joint pain or swelling.  No decreased range of motion.  No back pain.  Psych:  No change in mood or affect. No depression or anxiety.  No memory loss.        Objective:   Physical Exam  Filed  Vitals:   12/04/15 1413  BP: 118/82  Pulse: 63  Height:  (1.651 m)  Weight: 128 lb 6.4 oz (58.242 kg)  SpO2: 95%    GEN: A/Ox3; pleasant , NAD,elderly on O2   HEENT:  [place]/AT,  EACs-clear, TMs-wnl, NOSE-clear, THROAT-clear, no lesions, no postnasal drip or exudate noted.   NECK:  Supple w/ fair ROM; no JVD; normal carotid impulses w/o bruits; no thyromegaly or nodules palpated; no lymphadenopathy.  RESP  BB crackles no accessory muscle use, no dullness to percussion  CARD:  RRR, no m/r/g  , no peripheral edema, pulses intact, no cyanosis or clubbing.  GI:   Soft & nt; nml bowel sounds; no organomegaly or masses detected.  Musco: Warm bil, no deformities or joint swelling noted.   Neuro: alert, no focal deficits noted.    Skin: Warm, no lesions or rashes

## 2015-12-06 DIAGNOSIS — I10 Essential (primary) hypertension: Secondary | ICD-10-CM | POA: Diagnosis not present

## 2015-12-06 DIAGNOSIS — J8410 Pulmonary fibrosis, unspecified: Secondary | ICD-10-CM | POA: Diagnosis not present

## 2015-12-06 DIAGNOSIS — R05 Cough: Secondary | ICD-10-CM | POA: Diagnosis not present

## 2015-12-07 ENCOUNTER — Telehealth: Payer: Self-pay | Admitting: Adult Health

## 2015-12-07 MED ORDER — ONDANSETRON 4 MG PO TBDP: 4.0000 mg | ORAL_TABLET | Freq: Three times a day (TID) | ORAL | Status: DC | PRN

## 2015-12-07 NOTE — Telephone Encounter (Signed)
Spoke with [name] and notified of recs per TP  She verbalized understanding and rx was sent to pharm- we sent the sublingual tabs since zofran does does come in supp form

## 2015-12-07 NOTE — Telephone Encounter (Signed)
Oh no, tell her to hold OFEV .  Advance clear liquid diet  Can have zofran supposoitory #6 1 daily As needed  N/v  However if she is still sick and not eating x 3 days , may need to go to ER for evaluation for possible IV fluids as she may be dehydrated.  Can restart OFEV few days after back to baseline.  Please contact office for sooner follow up if symptoms do not improve or worsen or seek emergency care

## 2015-12-07 NOTE — Telephone Encounter (Signed)
Pt saw TP 12/04/15. Called spoke with Kendal Hymen [facility]). She reports on 2/21 pt received prevnar 13. Every since pt has had nausea, vomiting, loss of appetite, and is very weak. Pt tried to take oral zofran and was not able to keep this down. They found zofran suppository (out of date) and gave to pt last night and she did not have any vomiting after that. Requesting recs or if we can call in zofran suppository. Please advise TP thanks

## 2016-01-03 DIAGNOSIS — I10 Essential (primary) hypertension: Secondary | ICD-10-CM | POA: Diagnosis not present

## 2016-01-03 DIAGNOSIS — J8410 Pulmonary fibrosis, unspecified: Secondary | ICD-10-CM | POA: Diagnosis not present

## 2016-01-03 DIAGNOSIS — R05 Cough: Secondary | ICD-10-CM | POA: Diagnosis not present

## 2016-01-05 ENCOUNTER — Encounter: Payer: Self-pay | Admitting: Pulmonary Disease

## 2016-01-07 NOTE — Telephone Encounter (Signed)
BQ - please advise. Thanks. 

## 2016-01-08 ENCOUNTER — Encounter (HOSPITAL_COMMUNITY): Payer: Self-pay

## 2016-01-08 DIAGNOSIS — H8112 Benign paroxysmal vertigo, left ear: Secondary | ICD-10-CM | POA: Diagnosis not present

## 2016-01-09 DIAGNOSIS — R42 Dizziness and giddiness: Secondary | ICD-10-CM | POA: Diagnosis not present

## 2016-01-09 DIAGNOSIS — I739 Peripheral vascular disease, unspecified: Secondary | ICD-10-CM | POA: Diagnosis not present

## 2016-01-09 DIAGNOSIS — R51 Headache: Secondary | ICD-10-CM | POA: Diagnosis not present

## 2016-01-30 DIAGNOSIS — I10 Essential (primary) hypertension: Secondary | ICD-10-CM | POA: Diagnosis not present

## 2016-01-30 DIAGNOSIS — E782 Mixed hyperlipidemia: Secondary | ICD-10-CM | POA: Diagnosis not present

## 2016-01-30 DIAGNOSIS — K219 Gastro-esophageal reflux disease without esophagitis: Secondary | ICD-10-CM | POA: Diagnosis not present

## 2016-02-01 ENCOUNTER — Ambulatory Visit: Payer: Commercial Managed Care - HMO | Admitting: Internal Medicine

## 2016-02-03 DIAGNOSIS — I10 Essential (primary) hypertension: Secondary | ICD-10-CM | POA: Diagnosis not present

## 2016-02-03 DIAGNOSIS — J8410 Pulmonary fibrosis, unspecified: Secondary | ICD-10-CM | POA: Diagnosis not present

## 2016-02-03 DIAGNOSIS — R05 Cough: Secondary | ICD-10-CM | POA: Diagnosis not present

## 2016-02-06 DIAGNOSIS — I25111 Atherosclerotic heart disease of native coronary artery with angina pectoris with documented spasm: Secondary | ICD-10-CM | POA: Diagnosis not present

## 2016-02-06 DIAGNOSIS — I10 Essential (primary) hypertension: Secondary | ICD-10-CM | POA: Diagnosis not present

## 2016-02-06 DIAGNOSIS — M1612 Unilateral primary osteoarthritis, left hip: Secondary | ICD-10-CM | POA: Diagnosis not present

## 2016-02-06 DIAGNOSIS — J849 Interstitial pulmonary disease, unspecified: Secondary | ICD-10-CM | POA: Diagnosis not present

## 2016-02-06 DIAGNOSIS — J301 Allergic rhinitis due to pollen: Secondary | ICD-10-CM | POA: Diagnosis not present

## 2016-02-06 DIAGNOSIS — K219 Gastro-esophageal reflux disease without esophagitis: Secondary | ICD-10-CM | POA: Diagnosis not present

## 2016-02-06 DIAGNOSIS — R110 Nausea: Secondary | ICD-10-CM | POA: Diagnosis not present

## 2016-02-06 DIAGNOSIS — E782 Mixed hyperlipidemia: Secondary | ICD-10-CM | POA: Diagnosis not present

## 2016-02-21 ENCOUNTER — Encounter: Payer: Self-pay | Admitting: Internal Medicine

## 2016-02-21 ENCOUNTER — Ambulatory Visit (INDEPENDENT_AMBULATORY_CARE_PROVIDER_SITE_OTHER): Payer: Commercial Managed Care - HMO | Admitting: Internal Medicine

## 2016-02-21 VITALS — BP 136/90 | HR 86 | Ht 65.0 in | Wt 123.4 lb

## 2016-02-21 DIAGNOSIS — J84112 Idiopathic pulmonary fibrosis: Secondary | ICD-10-CM | POA: Diagnosis not present

## 2016-02-21 DIAGNOSIS — Z5181 Encounter for therapeutic drug level monitoring: Secondary | ICD-10-CM | POA: Diagnosis not present

## 2016-02-21 NOTE — Progress Notes (Signed)
Subjective:     Patient ID: [name], female   DOB: [birth year], 75 y.o.   MRN: [id number]  HPI   Synopsis: Former [facility] patient with fibrosis PFT"s 2009:  No obstruction, TLC 3.23 (70%), DLCO 9.4 (50%) CT chest 08/2014:  Volume loss left hemithorax, fibrotic changes L > R, subpleural reticulation with a few areas of honeycombing.  (progressive by CXR from 2009) Episode of FPARDS 2004, +exposure to macrodantin (but not on chronically) PFT"s 2016:  No obstruction, TLC 2.47 (50%), DLCO 7.90 (34%) Autoimmune labwork 10/2014:  Negative Barium swallow 2016:  +esophageal dysmotility and +aspiration noted. >> referred to speech.  04/2015 Modified barium swallow: no clear aspiration 04/2015 CT chest (HRCT) > worsening pulmonary fibrosis changes in bases L>R consistent with UIP (Entrikin read)   OV 10/23/2015 IOV with Dr. Marchelle Gearing   Chief Complaint  Patient presents with  . Follow-up    BQ pt here for second opinion. Pt c/o dry cough, pt states at times she will cough until emesis. Pt c/o DOE - at baseline. Pt denies CP/tightness.    Second opinion idiopathic primary fibrosis. Referred by Dr. Kendrick Fries. Specific question of starting nintedanib after having had severe GI side effects for Esbriet.  Review of the chart shows that she has high pretest probably ready idiopathic peripheral fibrosis based on clinical grounds of classic UIP CT chest read by thoracic radiologist, negative autoimmune profile Jan 2016 and a negative history that I elicited today in the office. This severe D base of January 2016 PFT is moderate based on FVC 52% and DLCO 34%. However it has been one year since her last PFT.  She is here with her daughter. They report that in 2006 seen shortly after starting Esbriet she had severe nausea and vomiting that ended up in the hospital. In our entire office: This the first admission for Esbriet related GI side effects. She is now off Esbriet. She wants to consider nintedanib therapy  and therefore she's been referred. In talking to her and her daughter they seem to have a decent understanding of IPF. They understood that it is a fibrotic lung disease. They understood that it is progressive. They understood that it is fatal. They understood that prognosis is few to several years. She denied any occupational exposures.  We also discussed therapy. She has specific questions about cardiac side affect profile off nintedanib. Review of the medication shows she is on DDAVP but she does not know this medication well. She also is on fish oil and she is unclear why. Her real concern with a cardiac side effect is because in 2004 she had massive heart attack for which she had a cardiac stent. Apparently after that she had "total body shutdown" and underwent status post tracheostomy. She was then followed by cardiology for a while. Apparently since then she is only on aspirin. She's been discharged from cardiac follow-up. She does not have exertional chest pain. She only has mild exertional shortness of breath and mild occasional dry cough. She supposed to use oxygen with exertion but she seldom does that. She used to see Dr. Riley Kill cardiology but has been discharged from follow-up because she was doing well. At this point in time does not have orthopnea, paroxysmal nocturnal dyspnea, hemoptysis, angina.  She also denies being on anticoagulation other than being on baby aspirin. She denies any bleeding episodes.  Review of the lab work   Autoimmune lab panel 10/25/2014 is negative including ANA, ACE, CCP, double-stranded DNA, rheumatoid  factor, SSA, SSB, SCL-70   Pulmonary function test 10/25/2014: FVC 1.45/52%, FEV1 1.2 L/57%, ratio 82. Total lung capacity 2.5 L/50%. DLCO 7.9/34%.  CT chest classic UIP as reported by thoracic radiology July 2016   12/04/2015 Follow up : ILD  Pt returns for 6 week follow up .  Seen last ov for second opionion for ILD .  She was started on OFEV. Previously  on Esbriet but unable to tolerate.  Had follow up PFT today , it does show worsening Diffusing defect  34% to 13%.  FEV1 55%, ratio 88, FVC 46%.  Says she is tolerating OFEV well. Has only had 2 episodes of diarrhea that was self limiting.  No n/v. Feels okay on OFEV.  Pt c/o mostly dry cough. Pt denies wheeze/increased SOB/CP/tightness.  Gets winded with long walking and inclines.  Wears O2 2l/m .  Echo showed 55-60%, PAP 41mmHg . Gr 1 DD     OV 02/21/2016  Chief Complaint  Patient presents with  . Follow-up    Pt states her breathing is unchanged since last OV. Pt c/o prod cough with white mucus. Pt denies CP/tightness.     Follow-up idiopathic pulmonary fibrosis. Started on Ofev in January 2017. Dr. Kendrick FriesMcQuaid patient  Shortly after starting Ofev at 150 mg twice daily she did have some GI side effects. Zofran and BRAT dietwas called in. Unclear what happened after that but sometime in February 2017 she had a Prevnar vaccine and after which she got nausea. At this point in time she was asked to hold Ofev temporarily and then restarted. It appears that she got a phone call probably from pharmaceutical support and since then she's been on 100 mg once daily which is a low-dose once daily [the [place] medicine tomorrow trial in 2011 shows this to be an ineffective dose]. She is tolerating this low-dose well without any problems. She is unaware of the fact that this isn't  An effective dose. She is willing to gingerly try the one in milligrams twice daily dose which is the minimum threshold for effectiveness. She is aware of side effect recurrence possibility. In terms of dyspnea feels this is stable. Last liver function test was in February and I will system but she says 2 weeks ago she had a primary care physician and was normal.     has a past medical history of Hyperlipidemia; Allergic rhinitis; GERD (gastroesophageal reflux disease); Idiopathic pulmonary fibrosis (HCC); CAD  (coronary artery disease); History of acute inferior wall MI; and Essential hypertension.   reports that she quit smoking about 16 years ago. Her smoking use included Cigarettes. She has a 25 pack-year smoking history. She does not have any smokeless tobacco history on file.  Past Surgical History  Procedure Laterality Date  . Gallbladder surgery    . Abdominal hysterectomy    . Arm surgery Left   . Bladder surgery      Allergies  Allergen Reactions  . Other     esbriet--severe GI side effects causing admission to hospital  . Codeine   . Penicillins   . Sulfonamide Derivatives     Immunization History  Administered Date(s) Administered  . Influenza Whole 07/31/2015  . Influenza-Unspecified 06/13/2013, 07/13/2014  . Pneumococcal Conjugate-13 12/04/2015  . Pneumococcal Polysaccharide-23 10/13/2012    Family History  Problem Relation Age of Onset  . Asthma Mother   . Stomach cancer Sister   . Colon cancer Son      Current outpatient prescriptions:  .  aspirin 81 MG tablet, Take 81 mg by mouth daily., Disp: , Rfl:  .  desmopressin (DDAVP) 0.2 MG tablet, Take 1 tablet by mouth at bedtime., Disp: , Rfl: 0 .  metoprolol succinate (TOPROL-XL) 50 MG 24 hr tablet, Take 50 mg by mouth daily. Take with or immediately following a meal., Disp: , Rfl:  .  mirtazapine (REMERON) 15 MG tablet, Take 15 mg by mouth at bedtime., Disp: , Rfl:  .  Multiple Vitamin (MULTIVITAMIN) tablet, Take 1 tablet by mouth daily., Disp: , Rfl:  .  MYRBETRIQ 50 MG TB24 tablet, Take 1 tablet by mouth daily., Disp: , Rfl: 1 .  OFEV 100 MG CAPS, Take 1 tablet by mouth 2 (two) times daily., Disp: , Rfl:  .  omeprazole (PRILOSEC) 20 MG capsule, Take 20 mg by mouth daily., Disp: , Rfl:  .  ondansetron (ZOFRAN) 4 MG tablet, Take 1 tablet (4 mg total) by mouth every 6 (six) hours as needed for nausea or vomiting., Disp: 45 tablet, Rfl: 2 .  simvastatin (ZOCOR) 40 MG tablet, Take 40 mg by mouth daily., Disp: , Rfl:   .  traMADol (ULTRAM) 50 MG tablet, 3 (three) times daily. For cough by Dr. Garner Nash, Disp: , Rfl: 0    Review of Systems     Objective:   Physical Exam  Filed Vitals:   02/21/16 1136  BP: 136/90  Pulse: 86  Height: 5\' 5"  (1.651 m)  Weight: 123 lb 6.4 oz (55.974 kg)  SpO2: 91%    Looks well. Bibasal crackles present. Alert and oriented 3 present. Normal speech normal heart sounds no edema soft abdomen    Assessment:       ICD-9-CM ICD-10-CM   1. IPF (idiopathic pulmonary fibrosis) (HCC) 516.31 J84.112   2. Encounter for therapeutic drug monitoring V58.83 Z51.81        Plan:      -  IPF appears stable per your history -100 mg of Ofev (Ninetadinib) is not effective dose  - Glad your liver function test 2 weeks ago with primary care physician was normal according to history  Plan  - Increase the Ofev back to 100 mg twice daily   - Have it with food   - Take some ginger capsules along with it   -  space doses adequately at least 10 hours apart   - if nausea or GI side effects return then you should not take Ofev ever again at any dose   Follow-up   - Full pulmonary function test in 1 month  - monitor progress. - Return to see myself or Dr. Kendrick Fries in 1 month   -  call us if you're having any problems tolerating the Ofev    Risk for se recurrence explained- she accepts and iwlling to proceeed     > 50% of this > 25 min visit spent in face to face counseling or coordination of care   Dr. Kalman Shan, M.D., [facility].C.P Pulmonary and Critical Care Medicine Staff Physician [facility]monary [facility] Critical Care Pager: [phone], If no answer or between  15:00h - 7:00h: call [phone]  02/21/2016 12:08 PM

## 2016-02-21 NOTE — Patient Instructions (Addendum)
ICD-9-CM ICD-10-CM   1. IPF (idiopathic pulmonary fibrosis) (HCC) 516.31 J84.112   2. Encounter for therapeutic drug monitoring V58.83 Z51.81    -  IPF appears stable per your history -100 mg of Ofev (Ninetadinib) is not effective dose  - Glad your liver function test 2 weeks ago with primary care physician was normal according to history  Plan  - Increase the Ofev back to 100 mg twice daily   - Have it with food   - Take some ginger capsules along with it   -  space doses adequately at least 10 hours apart   - if nausea or GI side effects return then you should not take Ofev ever again at any dose   Follow-up   - Full pulmonary function test in 1 month  - Return to see myself or Dr. Kendrick FriesMcQuaid in 1 month   -  call us if you're having any problems tolerating the Ofev  - Referred to pulmonary rehabilitation at follow-up.

## 2016-03-04 DIAGNOSIS — R05 Cough: Secondary | ICD-10-CM | POA: Diagnosis not present

## 2016-03-04 DIAGNOSIS — J8410 Pulmonary fibrosis, unspecified: Secondary | ICD-10-CM | POA: Diagnosis not present

## 2016-03-04 DIAGNOSIS — I10 Essential (primary) hypertension: Secondary | ICD-10-CM | POA: Diagnosis not present

## 2016-04-04 DIAGNOSIS — R05 Cough: Secondary | ICD-10-CM | POA: Diagnosis not present

## 2016-04-04 DIAGNOSIS — I10 Essential (primary) hypertension: Secondary | ICD-10-CM | POA: Diagnosis not present

## 2016-04-04 DIAGNOSIS — J8410 Pulmonary fibrosis, unspecified: Secondary | ICD-10-CM | POA: Diagnosis not present

## 2016-04-19 DIAGNOSIS — Z955 Presence of coronary angioplasty implant and graft: Secondary | ICD-10-CM | POA: Diagnosis not present

## 2016-04-19 DIAGNOSIS — I10 Essential (primary) hypertension: Secondary | ICD-10-CM | POA: Diagnosis not present

## 2016-04-19 DIAGNOSIS — N390 Urinary tract infection, site not specified: Secondary | ICD-10-CM | POA: Diagnosis not present

## 2016-04-19 DIAGNOSIS — R42 Dizziness and giddiness: Secondary | ICD-10-CM | POA: Diagnosis not present

## 2016-04-19 DIAGNOSIS — I2510 Atherosclerotic heart disease of native coronary artery without angina pectoris: Secondary | ICD-10-CM | POA: Diagnosis not present

## 2016-04-19 DIAGNOSIS — K5900 Constipation, unspecified: Secondary | ICD-10-CM | POA: Diagnosis not present

## 2016-04-19 DIAGNOSIS — E7800 Pure hypercholesterolemia, unspecified: Secondary | ICD-10-CM | POA: Diagnosis not present

## 2016-04-19 DIAGNOSIS — I252 Old myocardial infarction: Secondary | ICD-10-CM | POA: Diagnosis not present

## 2016-04-19 DIAGNOSIS — K219 Gastro-esophageal reflux disease without esophagitis: Secondary | ICD-10-CM | POA: Diagnosis not present

## 2016-04-19 DIAGNOSIS — R1110 Vomiting, unspecified: Secondary | ICD-10-CM | POA: Diagnosis not present

## 2016-05-01 ENCOUNTER — Ambulatory Visit (INDEPENDENT_AMBULATORY_CARE_PROVIDER_SITE_OTHER): Payer: Commercial Managed Care - HMO | Admitting: Pulmonary Disease

## 2016-05-01 ENCOUNTER — Encounter: Payer: Self-pay | Admitting: Pulmonary Disease

## 2016-05-01 VITALS — BP 126/72 | HR 75 | Ht 65.0 in | Wt 114.4 lb

## 2016-05-01 DIAGNOSIS — R112 Nausea with vomiting, unspecified: Secondary | ICD-10-CM | POA: Diagnosis not present

## 2016-05-01 DIAGNOSIS — J84112 Idiopathic pulmonary fibrosis: Secondary | ICD-10-CM

## 2016-05-01 DIAGNOSIS — J9611 Chronic respiratory failure with hypoxia: Secondary | ICD-10-CM | POA: Diagnosis not present

## 2016-05-01 DIAGNOSIS — R0600 Dyspnea, unspecified: Secondary | ICD-10-CM

## 2016-05-01 LAB — PULMONARY FUNCTION TEST
DL/VA % PRED: 52 %
DL/VA: 2.45 ml/min/mmHg/L
DLCO cor % pred: 22 %
DLCO cor: 5.16 ml/min/mmHg
DLCO unc % pred: 22 %
DLCO unc: 5.21 ml/min/mmHg
FEF 25-75 POST: 1.43 L/s
FEF 25-75 Pre: 1.09 L/sec
FEF2575-%CHANGE-POST: 30 %
FEF2575-%PRED-POST: 88 %
FEF2575-%Pred-Pre: 67 %
FEV1-%CHANGE-POST: 3 %
FEV1-%PRED-POST: 59 %
FEV1-%Pred-Pre: 57 %
FEV1-Post: 1.2 L
FEV1-Pre: 1.16 L
FEV1FVC-%Change-Post: 9 %
FEV1FVC-%Pred-Pre: 107 %
FEV6-%Change-Post: -6 %
FEV6-%Pred-Post: 52 %
FEV6-%Pred-Pre: 56 %
FEV6-PRE: 1.44 L
FEV6-Post: 1.34 L
FEV6FVC-%CHANGE-POST: 0 %
FEV6FVC-%PRED-PRE: 104 %
FEV6FVC-%Pred-Post: 104 %
FVC-%CHANGE-POST: -5 %
FVC-%Pred-Post: 50 %
FVC-%Pred-Pre: 53 %
FVC-Post: 1.36 L
FVC-Pre: 1.45 L
POST FEV1/FVC RATIO: 88 %
POST FEV6/FVC RATIO: 99 %
PRE FEV6/FVC RATIO: 100 %
Pre FEV1/FVC ratio: 80 %
RV % pred: 50 %
RV: 1.13 L
TLC % pred: 53 %
TLC: 2.63 L

## 2016-05-01 MED ORDER — PROMETHAZINE HCL 12.5 MG PO TABS: 12.5000 mg | ORAL_TABLET | Freq: Four times a day (QID) | ORAL | Status: AC | PRN

## 2016-05-01 NOTE — Patient Instructions (Signed)
Keep taking your medications as your doing Use Phenergan as needed for nausea Keep using her oxygen as you are doing I will see you back in 3 months or sooner if needed

## 2016-05-01 NOTE — Assessment & Plan Note (Signed)
This is a complication of Ofev and unfortunately is difficult to deal with. At this point I don't think it's something that necessitate stopping the Ofev. Unfortunately we can't use aspirin a considering the severe GI side effects she had from that.  Plan: Bratt diet encourage Use Phenergan as needed for nausea

## 2016-05-01 NOTE — Assessment & Plan Note (Signed)
Continue 2 L of oxygen continuously. 

## 2016-05-01 NOTE — Progress Notes (Signed)
PFT done today. 

## 2016-05-01 NOTE — Progress Notes (Signed)
Subjective:    Patient ID: [name], female    DOB: [birth year], 75 y.o.   MRN: [id number]  Synopsis: Former [facility] patient with fibrosis PFT"s 2009:  No obstruction, TLC 3.23 (70%), DLCO 9.4 (50%) CT chest 08/2014:  Volume loss left hemithorax, fibrotic changes L > R, subpleural reticulation with a few areas of honeycombing.  (progressive by CXR from 2009) Episode of FPARDS 2004, +exposure to macrodantin (but not on chronically) PFT"s 2016:  No obstruction, TLC 2.47 (50%), DLCO 7.90 (34%) Autoimmune labwork 10/2014:  Negative Barium swallow 2016:  +esophageal dysmotility and +aspiration noted. >> referred to speech.  04/2015 Modified barium swallow: no clear aspiration 04/2015 CT chest (HRCT) > worsening pulmonary fibrosis changes in bases L>R consistent with UIP (Entrikin read) July 2017 lung function testing ratio 88%, FVC 1.36 L (50% predicted), total lung capacity 2.63 L (53% predicted), DLCO 5.21 (22% predicted).  HPI Chief Complaint  Patient presents with  . Follow-up    review PFT.  Pt c/o sob with exertion, nonprod cough.  Pt is down on Ofev dosing to 100mg  qd instead of BID.   [name] had a UTI a few days ago and had to go to the ER.  She was treated for nausea.  She continues to have some nausea which is associated with lack of appetite and anorexia with weight loss.  She has lost 31 pounds since she started anti-fibrotic therapy.  Breathing: not too good in the heat, better inside.  Remains active cleaning her house, etc. She has been coughing more than usual lately, dry hacking cough.    She called the Ofev patient information line and they advised to skip a pill if her symptoms.   Past Medical History  Diagnosis Date  . Hyperlipidemia   . Allergic rhinitis   . GERD (gastroesophageal reflux disease)   . Idiopathic pulmonary fibrosis (HCC)     Followed by Dr. Marchelle Gearingamaswamy  . CAD (coronary artery disease)     HepaCoat stent to proximal RCA 2004 with acute MI  . History  of acute inferior wall MI     Associated with RV infarct and cardiogenic shock 2004   . Essential hypertension       Review of Systems  Constitutional: Negative for fever, diaphoresis and fatigue.  HENT: Negative for postnasal drip, rhinorrhea and sinus pressure.   Respiratory: Positive for cough and shortness of breath. Negative for wheezing.   Cardiovascular: Negative for chest pain, palpitations and leg swelling.       Objective:   Physical Exam Filed Vitals:   05/01/16 1447  BP: 126/72  Pulse: 75  Height: 5\' 5"  (1.651 m)  Weight: 114 lb 6.4 oz (51.891 kg)  SpO2: 95%   2L   Gen: chronically ill appearing HENT: OP clear, TM's clear, neck supple PULM: Crackles 1/2 way up bilaterally, normal effort CV: RRR, no mgr, trace edema GI: BS+, soft, nontender Derm: no cyanosis or rash Psyche: normal mood and affect   Records from my partner reviewed where he treated her for idiopathic pulmonary fibrosis  [place] 2017 liver function testing negative.      Assessment & Plan:  Nausea with vomiting This is a complication of Ofev and unfortunately is difficult to deal with. At this point I don't think it's something that necessitate stopping the Ofev. Unfortunately we can't use aspirin a considering the severe GI side effects she had from that.  Plan: Bratt diet encourage Use Phenergan as needed for nausea  UIP (  usual interstitial pneumonitis) (HCC) Lung function testing today shows mild improvement compared to her last study. In general, her lung function testing has declined compared to a year ago which is consistent with idiopathic pulmonary fibrosis.  Today we again reviewed the fact that and a fibrotic therapy only slow progression of disease. Sadly, she suffered from significant side effects from the treatment.  Plan: Continue Ofev Obtain results from primary care physician's office for liver function testing to monitor for drug toxicity 6 minute walk Repeat lung  function test in 6 months Follow-up 3 months  Chronic hypoxemic respiratory failure (HCC) Continue 2 L of oxygen continuously     Current outpatient prescriptions:  .  aspirin 81 MG tablet, Take 81 mg by mouth daily., Disp: , Rfl:  .  desmopressin (DDAVP) 0.2 MG tablet, Take 1 tablet by mouth at bedtime., Disp: , Rfl: 0 .  metoprolol succinate (TOPROL-XL) 50 MG 24 hr tablet, Take 50 mg by mouth daily. Take with or immediately following a meal., Disp: , Rfl:  .  mirtazapine (REMERON) 15 MG tablet, Take 15 mg by mouth at bedtime., Disp: , Rfl:  .  Multiple Vitamin (MULTIVITAMIN) tablet, Take 1 tablet by mouth daily., Disp: , Rfl:  .  MYRBETRIQ 50 MG TB24 tablet, Take 1 tablet by mouth daily., Disp: , Rfl: 1 .  OFEV 100 MG CAPS, Take 1 tablet by mouth daily. , Disp: , Rfl:  .  omeprazole (PRILOSEC) 20 MG capsule, Take 20 mg by mouth daily., Disp: , Rfl:  .  ondansetron (ZOFRAN) 4 MG tablet, Take 1 tablet (4 mg total) by mouth every 6 (six) hours as needed for nausea or vomiting., Disp: 45 tablet, Rfl: 2 .  simvastatin (ZOCOR) 40 MG tablet, Take 40 mg by mouth daily., Disp: , Rfl:  .  traMADol (ULTRAM) 50 MG tablet, 3 (three) times daily. For cough by Dr. Garner Nash, Disp: , Rfl: 0 .  promethazine (PHENERGAN) 12.5 MG tablet, Take 1 tablet (12.5 mg total) by mouth every 6 (six) hours as needed for nausea or vomiting., Disp: 30 tablet, Rfl: 0

## 2016-05-01 NOTE — Assessment & Plan Note (Signed)
Lung function testing today shows mild improvement compared to her last study. In general, her lung function testing has declined compared to a year ago which is consistent with idiopathic pulmonary fibrosis.  Today we again reviewed the fact that and a fibrotic therapy only slow progression of disease. Sadly, she suffered from significant side effects from the treatment.  Plan: Continue Ofev Obtain results from primary care physician's office for liver function testing to monitor for drug toxicity 6 minute walk Repeat lung function test in 6 months Follow-up 3 months

## 2016-05-04 DIAGNOSIS — I10 Essential (primary) hypertension: Secondary | ICD-10-CM | POA: Diagnosis not present

## 2016-05-04 DIAGNOSIS — R05 Cough: Secondary | ICD-10-CM | POA: Diagnosis not present

## 2016-05-04 DIAGNOSIS — J8410 Pulmonary fibrosis, unspecified: Secondary | ICD-10-CM | POA: Diagnosis not present

## 2016-06-04 DIAGNOSIS — J8410 Pulmonary fibrosis, unspecified: Secondary | ICD-10-CM | POA: Diagnosis not present

## 2016-06-04 DIAGNOSIS — R05 Cough: Secondary | ICD-10-CM | POA: Diagnosis not present

## 2016-06-04 DIAGNOSIS — I10 Essential (primary) hypertension: Secondary | ICD-10-CM | POA: Diagnosis not present

## 2016-06-10 DIAGNOSIS — I10 Essential (primary) hypertension: Secondary | ICD-10-CM | POA: Diagnosis not present

## 2016-06-10 DIAGNOSIS — K219 Gastro-esophageal reflux disease without esophagitis: Secondary | ICD-10-CM | POA: Diagnosis not present

## 2016-06-10 DIAGNOSIS — E782 Mixed hyperlipidemia: Secondary | ICD-10-CM | POA: Diagnosis not present

## 2016-06-13 DIAGNOSIS — J849 Interstitial pulmonary disease, unspecified: Secondary | ICD-10-CM | POA: Diagnosis not present

## 2016-06-13 DIAGNOSIS — K219 Gastro-esophageal reflux disease without esophagitis: Secondary | ICD-10-CM | POA: Diagnosis not present

## 2016-06-13 DIAGNOSIS — R300 Dysuria: Secondary | ICD-10-CM | POA: Diagnosis not present

## 2016-06-13 DIAGNOSIS — J84112 Idiopathic pulmonary fibrosis: Secondary | ICD-10-CM | POA: Diagnosis not present

## 2016-06-13 DIAGNOSIS — N390 Urinary tract infection, site not specified: Secondary | ICD-10-CM | POA: Diagnosis not present

## 2016-06-13 DIAGNOSIS — I25111 Atherosclerotic heart disease of native coronary artery with angina pectoris with documented spasm: Secondary | ICD-10-CM | POA: Diagnosis not present

## 2016-06-13 DIAGNOSIS — R110 Nausea: Secondary | ICD-10-CM | POA: Diagnosis not present

## 2016-06-13 DIAGNOSIS — B9620 Unspecified Escherichia coli [E. coli] as the cause of diseases classified elsewhere: Secondary | ICD-10-CM | POA: Diagnosis not present

## 2016-06-13 DIAGNOSIS — E782 Mixed hyperlipidemia: Secondary | ICD-10-CM | POA: Diagnosis not present

## 2016-06-13 DIAGNOSIS — I10 Essential (primary) hypertension: Secondary | ICD-10-CM | POA: Diagnosis not present

## 2016-06-13 DIAGNOSIS — M1612 Unilateral primary osteoarthritis, left hip: Secondary | ICD-10-CM | POA: Diagnosis not present

## 2016-06-13 DIAGNOSIS — J301 Allergic rhinitis due to pollen: Secondary | ICD-10-CM | POA: Diagnosis not present

## 2016-06-13 DIAGNOSIS — Z6820 Body mass index (BMI) 20.0-20.9, adult: Secondary | ICD-10-CM | POA: Diagnosis not present

## 2016-07-05 DIAGNOSIS — R05 Cough: Secondary | ICD-10-CM | POA: Diagnosis not present

## 2016-07-05 DIAGNOSIS — J8410 Pulmonary fibrosis, unspecified: Secondary | ICD-10-CM | POA: Diagnosis not present

## 2016-07-05 DIAGNOSIS — I10 Essential (primary) hypertension: Secondary | ICD-10-CM | POA: Diagnosis not present

## 2016-07-21 DIAGNOSIS — Z23 Encounter for immunization: Secondary | ICD-10-CM | POA: Diagnosis not present

## 2016-08-04 ENCOUNTER — Encounter: Payer: Self-pay | Admitting: Pulmonary Disease

## 2016-08-04 ENCOUNTER — Ambulatory Visit (INDEPENDENT_AMBULATORY_CARE_PROVIDER_SITE_OTHER): Payer: Commercial Managed Care - HMO | Admitting: Pulmonary Disease

## 2016-08-04 VITALS — BP 120/76 | HR 75 | Ht 65.0 in | Wt 113.0 lb

## 2016-08-04 DIAGNOSIS — R05 Cough: Secondary | ICD-10-CM | POA: Diagnosis not present

## 2016-08-04 DIAGNOSIS — J84112 Idiopathic pulmonary fibrosis: Secondary | ICD-10-CM

## 2016-08-04 DIAGNOSIS — I10 Essential (primary) hypertension: Secondary | ICD-10-CM | POA: Diagnosis not present

## 2016-08-04 DIAGNOSIS — J8410 Pulmonary fibrosis, unspecified: Secondary | ICD-10-CM | POA: Diagnosis not present

## 2016-08-04 MED ORDER — MEGESTROL ACETATE 400 MG/10ML PO SUSP: 480.0000 mg | Freq: Every day | ORAL | 2 refills | Status: DC

## 2016-08-04 NOTE — Progress Notes (Signed)
History of Present Illness [name] is a 75 y.o. female with Idiopathic Pulmonary Fibrosis,  former [facility] patient, currently followed by Dr. Kendrick Fries. She is currently taking Ofev and she wears oxygen at 2L [place] with exertion and .  Synopsis: Former [facility] patient with fibrosis PFT"s 2009:  No obstruction, TLC 3.23 (70%), DLCO 9.4 (50%) CT chest 08/2014:  Volume loss left hemithorax, fibrotic changes L > R, subpleural reticulation with a few areas of honeycombing.  (progressive by CXR from 2009) Episode of FPARDS 2004, +exposure to macrodantin (but not on chronically) PFT"s 2016:  No obstruction, TLC 2.47 (50%), DLCO 7.90 (34%) Autoimmune labwork 10/2014:  Negative Barium swallow 2016:  +esophageal dysmotility and +aspiration noted. >> referred to speech.  04/2015 Modified barium swallow: no clear aspiration 04/2015 CT chest (HRCT) > worsening pulmonary fibrosis changes in bases L>R consistent with UIP (Entrikin read) July 2017 lung function testing ratio 88%, FVC 1.36 L (50% predicted), total lung capacity 2.63 L (53% predicted), DLCO 5.21 (22% predicted). 2016: Hospitalized for Severe N&V related to Esbriet ( Hypovolemia) 08/04/2016 Follow Up OV for IPF:  Pt. Presents to the office today for follow up of her IPF and hypoxemia. She states that she feels like she has a cold. She has had it for about 1 week.  She states it is actually  Better today. She continues taking the OFEV one tablet once daily, as she could not tolerate 2 tabs daily due to diarrhea and nausea. She has lost 16  pounds over the last 11 months We have discussed taking Boost as a nutritional supplement. She states she has no appetite.She is wearing her oxygen with exertion daily, and for 12 hours at night.She had labs to evaluate LFT's about 1 month ago per her PCP. We will need to evaluate these.She will need PFT's in January 2018.   Tests July 2017 lung function testing ratio 88%, FVC 1.36 L (50% predicted), total lung  capacity 2.63 L (53% predicted), DLCO 5.21 (22% predicted).   Past medical hx Past Medical History:  Diagnosis Date  . Allergic rhinitis   . CAD (coronary artery disease)    HepaCoat stent to proximal RCA 2004 with acute MI  . Essential hypertension   . GERD (gastroesophageal reflux disease)   . History of acute inferior wall MI    Associated with RV infarct and cardiogenic shock 2004   . Hyperlipidemia   . Idiopathic pulmonary fibrosis (HCC)    Followed by Dr. Marchelle Gearing     Past surgical hx, Family hx, Social hx all reviewed.  Current Outpatient Prescriptions on File Prior to Visit  Medication Sig  . aspirin 81 MG tablet Take 81 mg by mouth daily.  Marland Kitchen desmopressin (DDAVP) 0.2 MG tablet Take 1 tablet by mouth at bedtime.  . metoprolol succinate (TOPROL-XL) 50 MG 24 hr tablet Take 50 mg by mouth daily. Take with or immediately following a meal.  . mirtazapine (REMERON) 15 MG tablet Take 15 mg by mouth at bedtime.  . Multiple Vitamin (MULTIVITAMIN) tablet Take 1 tablet by mouth daily.  Marland Kitchen MYRBETRIQ 50 MG TB24 tablet Take 1 tablet by mouth daily.  Marland Kitchen OFEV 100 MG CAPS Take 1 tablet by mouth daily.   Marland Kitchen omeprazole (PRILOSEC) 20 MG capsule Take 20 mg by mouth daily.  . promethazine (PHENERGAN) 12.5 MG tablet Take 1 tablet (12.5 mg total) by mouth every 6 (six) hours as needed for nausea or vomiting.  . simvastatin (ZOCOR) 40 MG tablet Take  40 mg by mouth daily.  . traMADol (ULTRAM) 50 MG tablet 3 (three) times daily. For cough by Dr. Garner Nashaniels   No current facility-administered medications on file prior to visit.      Allergies  Allergen Reactions  . Other     esbriet--severe GI side effects causing admission to hospital  . Codeine   . Penicillins   . Sulfonamide Derivatives     Review Of Systems:  Constitutional:   +  weight loss,no  night sweats,  Fevers, chills, fatigue, or  lassitude.  HEENT:   No headaches,  Difficulty swallowing,  Tooth/dental problems, or  Sore throat,                 No sneezing, itching, ear ache, nasal congestion, post nasal drip,   CV:  No chest pain,  Orthopnea, PND, swelling in lower extremities, anasarca, dizziness, palpitations, syncope.   GI  No heartburn, indigestion, abdominal pain, nausea, vomiting, diarrhea, change in bowel habits, loss of appetite, bloody stools.   Resp: + shortness of breath with exertion less  at rest.  No excess mucus, no productive cough,  No non-productive cough,  No coughing up of blood.  No change in color of mucus.  No wheezing.  No chest wall deformity  Skin: no rash or lesions.  GU: no dysuria, change in color of urine, no urgency or frequency.  No flank pain, no hematuria   MS:  No joint pain or swelling.  No decreased range of motion.  No back pain.  Psych:  No change in mood or affect. No depression or anxiety.  No memory loss.   Vital Signs BP 120/76 (BP Location: Left Arm, Cuff Size: Normal)   Pulse 75   Ht 5\' 5"  (1.651 m)   Wt 113 lb (51.3 kg)   LMP  (LMP Unknown)   SpO2 92%   BMI 18.80 kg/m    Physical Exam:  General- No distress,  A&Ox3, pleasant and frail. ENT: No sinus tenderness, TM clear, pale nasal mucosa, no oral exudate,no post nasal drip, no LAN Cardiac: S1, S2, regular rate and rhythm, no murmur Chest: No wheeze/ Crackles bilaterally 3/4 way up,no dullness; no accessory muscle use, no nasal flaring, no sternal retractions Abd.: Soft Non-tender Ext: No clubbing cyanosis, edema Neuro:  normal strength Skin: No rashes, warm and dry Psych: normal mood and behavior   Assessment/Plan  IPF Continue your Ofev 100 mg  once daily as you have been doing. We will check with your Primary Care doctor regarding your labs. Pulmonary function tests in January. Follow up with Dr. Kendrick FriesMcQuaid in 3 months.( PFT's prior to OV) Please contact office for sooner follow up if symptoms do not improve or worsen or seek emergency care.   Calorie Malnutrition 16 pound weight loss over last  year Plan Continue taking the Boost daily. Megace 480 mg each day to stimulate your appetite. CMET in January to assess renal function while on Megace. Follow up with Dr. Kendrick FriesMcQuaid in 3 months.( PFT's prior to OV) Please contact office for sooner follow up if symptoms do not improve or worsen or seek emergency care.   Chronic Hypoxemic Respiratory Failure Continue wearing oxygen at 2 L [place] with exertion and at bedtime.    Bevelyn NgoSarah F Rudean Icenhour, NP 08/04/2016  4:05 PM

## 2016-08-04 NOTE — Patient Instructions (Addendum)
It is nice to meet you today. Continue your Ofev 100 mg  once daily as you have been doing. Continue wearing your oxygen at 2 L with exertion and at night as you have been doing. We will check with your Primary Care doctor regarding your labs. Pulmonary function tests in January. Continue taking the Boost daily. Megace 480 mg each day to stimulate your appetite. CMET in January to assess renal function while on Megace. Follow up with Dr. Kendrick FriesMcQuaid in 3 months.( PFT's prior to OV) Please contact office for sooner follow up if symptoms do not improve or worsen or seek emergency care.

## 2016-08-04 NOTE — Progress Notes (Signed)
Attending:  I have seen and examined the patient with nurse practitioner/resident and agree with the note above.  We formulated the plan together and I elicited the following history.    Her let us weight has come down by pounds since last visit she continues to have no appetite. She uses oxygen with exertion.  On exam: Crackles halfway up bilaterally Lungs clear to auscultation Cardiovascular regular rate and rhythm  Records reviewed, she was hospitalized in late 2016 for side effects of Esbriet, severe nausea and vomiting  Idiopathic pulmonary fibrosis: Complicated case, declining pulmonate function testing with severe reaction to Esbriet and now having some weight loss which I think is likely related to a low-fat. I think that taking a low-dose of Ofev (100 mg daily is better than nothing but it's not really attested dose. If there are clinical trial available for idiopathic fibrosis I would refer her right away but unfortunately we don't have that lecturing. In the meantime we will continue Ofev at current dosing, add megace.  Check LFT from recent PCP visit, PFT next visit  > 50% of todays 26 min visit face to face  Heber CarolinaBrent Akin Yi, MD [facility] Pager: [phone] Cell: [phone] After 3pm or if no response, call [phone]

## 2016-09-04 DIAGNOSIS — J8410 Pulmonary fibrosis, unspecified: Secondary | ICD-10-CM | POA: Diagnosis not present

## 2016-09-04 DIAGNOSIS — I10 Essential (primary) hypertension: Secondary | ICD-10-CM | POA: Diagnosis not present

## 2016-09-04 DIAGNOSIS — R05 Cough: Secondary | ICD-10-CM | POA: Diagnosis not present

## 2016-09-12 ENCOUNTER — Encounter: Payer: Self-pay | Admitting: Pulmonary Disease

## 2016-10-04 DIAGNOSIS — I10 Essential (primary) hypertension: Secondary | ICD-10-CM | POA: Diagnosis not present

## 2016-10-04 DIAGNOSIS — R05 Cough: Secondary | ICD-10-CM | POA: Diagnosis not present

## 2016-10-04 DIAGNOSIS — J8410 Pulmonary fibrosis, unspecified: Secondary | ICD-10-CM | POA: Diagnosis not present

## 2016-10-22 DIAGNOSIS — E782 Mixed hyperlipidemia: Secondary | ICD-10-CM | POA: Diagnosis not present

## 2016-10-22 DIAGNOSIS — I25111 Atherosclerotic heart disease of native coronary artery with angina pectoris with documented spasm: Secondary | ICD-10-CM | POA: Diagnosis not present

## 2016-10-22 DIAGNOSIS — J449 Chronic obstructive pulmonary disease, unspecified: Secondary | ICD-10-CM | POA: Diagnosis not present

## 2016-10-22 DIAGNOSIS — I10 Essential (primary) hypertension: Secondary | ICD-10-CM | POA: Diagnosis not present

## 2016-10-24 DIAGNOSIS — M1612 Unilateral primary osteoarthritis, left hip: Secondary | ICD-10-CM | POA: Diagnosis not present

## 2016-10-24 DIAGNOSIS — J849 Interstitial pulmonary disease, unspecified: Secondary | ICD-10-CM | POA: Diagnosis not present

## 2016-10-24 DIAGNOSIS — J301 Allergic rhinitis due to pollen: Secondary | ICD-10-CM | POA: Diagnosis not present

## 2016-10-24 DIAGNOSIS — I10 Essential (primary) hypertension: Secondary | ICD-10-CM | POA: Diagnosis not present

## 2016-10-24 DIAGNOSIS — R300 Dysuria: Secondary | ICD-10-CM | POA: Diagnosis not present

## 2016-10-24 DIAGNOSIS — R110 Nausea: Secondary | ICD-10-CM | POA: Diagnosis not present

## 2016-10-24 DIAGNOSIS — J9611 Chronic respiratory failure with hypoxia: Secondary | ICD-10-CM | POA: Diagnosis not present

## 2016-10-24 DIAGNOSIS — J84112 Idiopathic pulmonary fibrosis: Secondary | ICD-10-CM | POA: Diagnosis not present

## 2016-10-24 DIAGNOSIS — I25111 Atherosclerotic heart disease of native coronary artery with angina pectoris with documented spasm: Secondary | ICD-10-CM | POA: Diagnosis not present

## 2016-10-24 DIAGNOSIS — E782 Mixed hyperlipidemia: Secondary | ICD-10-CM | POA: Diagnosis not present

## 2016-10-24 DIAGNOSIS — K219 Gastro-esophageal reflux disease without esophagitis: Secondary | ICD-10-CM | POA: Diagnosis not present

## 2016-11-04 DIAGNOSIS — I10 Essential (primary) hypertension: Secondary | ICD-10-CM | POA: Diagnosis not present

## 2016-11-04 DIAGNOSIS — J8410 Pulmonary fibrosis, unspecified: Secondary | ICD-10-CM | POA: Diagnosis not present

## 2016-11-04 DIAGNOSIS — R05 Cough: Secondary | ICD-10-CM | POA: Diagnosis not present

## 2016-11-10 ENCOUNTER — Ambulatory Visit (INDEPENDENT_AMBULATORY_CARE_PROVIDER_SITE_OTHER): Payer: Medicare HMO | Admitting: Pulmonary Disease

## 2016-11-10 ENCOUNTER — Encounter: Payer: Self-pay | Admitting: Pulmonary Disease

## 2016-11-10 DIAGNOSIS — R531 Weakness: Secondary | ICD-10-CM | POA: Diagnosis not present

## 2016-11-10 DIAGNOSIS — J9611 Chronic respiratory failure with hypoxia: Secondary | ICD-10-CM | POA: Diagnosis not present

## 2016-11-10 DIAGNOSIS — J84112 Idiopathic pulmonary fibrosis: Secondary | ICD-10-CM

## 2016-11-10 NOTE — Patient Instructions (Signed)
Take Ofev as you are doing Keep using oxygen 24 hours a day We will refer you to the neurology office for evaluation of the symptoms she had last week. If you have something like this happen again go the emergency room. Keep taking aspirin. If the neurologist is okay with that he can start exercising after that visit. We will see you back in 3 months

## 2016-11-10 NOTE — Progress Notes (Addendum)
Subjective:    Patient ID: [name], female    DOB: Jul 14, 1941, 76 y.o.   MRN: [id number]  Synopsis: Former [facility] patient with fibrosis PFT"s 2009:  No obstruction, TLC 3.23 (70%), DLCO 9.4 (50%) CT chest 08/2014:  Volume loss left hemithorax, fibrotic changes L > R, subpleural reticulation with a few areas of honeycombing.  (progressive by CXR from 2009) Episode of FPARDS 2004, +exposure to macrodantin (but not on chronically) PFT"s 2016:  No obstruction, TLC 2.47 (50%), DLCO 7.90 (34%) Autoimmune labwork 10/2014:  Negative Barium swallow 2016:  +esophageal dysmotility and +aspiration noted. >> referred to speech.  04/2015 Modified barium swallow: no clear aspiration 04/2015 CT chest (HRCT) > worsening pulmonary fibrosis changes in bases L>R consistent with UIP (Entrikin read) July 2017 lung function testing ratio 88%, FVC 1.36 L (50% predicted), total lung capacity 2.63 L (53% predicted), DLCO 5.21 (22% predicted).  HPI Chief Complaint  Patient presents with  . Follow-up    review PFT.  pt c/o sob with exertion.    [facility] says that she has days when she "gives out" due to dyspnea.  On Thursday of last week she had transient left arm weakness and felt dizzy and had wobbly legs.  She tried to go see her doctor and her symptoms went away after a few hours.  She did not feel more short of breath that day.  She has not been exercising because she is afraid to do it.    She is doing OK on Ofev 100mg  bid.  She has some diarrhea on some days but not every day.  She says that there are times when she will go a week or two without it.  She has noticed thinning of her hair.  She says that she has not had a rash.  She uses 2L of oxygen continuously.    Past Medical History:  Diagnosis Date  . Allergic rhinitis   . CAD (coronary artery disease)    HepaCoat stent to proximal RCA 2004 with acute MI  . Essential hypertension   . GERD (gastroesophageal reflux disease)   . History of acute  inferior wall MI    Associated with RV infarct and cardiogenic shock 2004   . Hyperlipidemia   . Idiopathic pulmonary fibrosis (HCC)    Followed by Dr. Marchelle Gearingamaswamy      Review of Systems  Constitutional: Negative for diaphoresis, fatigue and fever.  HENT: Negative for postnasal drip, rhinorrhea and sinus pressure.   Respiratory: Positive for cough and shortness of breath. Negative for wheezing.   Cardiovascular: Negative for chest pain, palpitations and leg swelling.       Objective:   Physical Exam Vitals:   11/10/16 1406  BP: 134/76  Pulse: 97  SpO2: 90%  Weight: 120 lb (54.4 kg)  Height: 5\' 5"  (1.651 m)   2L   Ambulated 500 feet on 2 L nasal cannula and her O2 saturation dropped to 86%, improved with 3L [place]  Gen: well appearing HENT: OP clear, TM's clear, neck supple PULM: Crackles bases B, normal percussion CV: RRR, slight systolic murmur, trace edema GI: BS+, soft, nontender Derm: no cyanosis or rash Psyche: normal mood and affect Neuro: awake, alert, moves all four extremities well, clear speech   Labworkfromherprimarycarephysician'sofficereviewedfrom01/07/2017 comprehensive metabolic panel showed normal kidney and liver function, hemoglobin was 14.4  PFT performed 11/10/2016 FVC 1.26 L 47% predicted, FEV1 1.0 L 50% predicted. Total lung capacity 2.25 L 45% predicted, DLCO 5. 5.58 24 percent  predicted       Assessment & Plan:  UIP (usual interstitial pneumonitis) (HCC) She has idiopathic pulmonary fibrosis. She has been tolerating the low-dose of Ofev without difficulty. Her liver function testing today shows no evidence of liver toxicity. She will need to continue to have her liver function test routinely. It should be noted that she was severely intolerant to Esbriet.  Overall her condition has worsened over the last 2 years.  Plan: Follow-up 3 months Repeat 6 minute walk and pulmonary function test in 6 months I told her today to hold off on exercising  until she has been cleared by a neuro-stroke specialist.  Chronic hypoxemic respiratory failure (HCC) Increase O2 to 3 L of oxygen continuously.  Left-sided weakness She had transient left-sided weakness and dizziness a week ago which was not associated with a change in her respiratory symptoms. We will refer her to a neurologist for further evaluation as I'm concerned she may have had a TIA. She was counseled in the emergency room if this happens again. She was advised to continue taking aspirin daily.    Current Outpatient Prescriptions:  .  aspirin 81 MG tablet, Take 81 mg by mouth daily., Disp: , Rfl:  .  desmopressin (DDAVP) 0.2 MG tablet, Take 1 tablet by mouth at bedtime., Disp: , Rfl: 0 .  megestrol (MEGACE) 400 MG/10ML suspension, Take 12 mLs (480 mg total) by mouth daily., Disp: 360 mL, Rfl: 2 .  metoprolol succinate (TOPROL-XL) 50 MG 24 hr tablet, Take 50 mg by mouth daily. Take with or immediately following a meal., Disp: , Rfl:  .  mirtazapine (REMERON) 15 MG tablet, Take 15 mg by mouth at bedtime., Disp: , Rfl:  .  Multiple Vitamin (MULTIVITAMIN) tablet, Take 1 tablet by mouth daily., Disp: , Rfl:  .  MYRBETRIQ 50 MG TB24 tablet, Take 1 tablet by mouth daily., Disp: , Rfl: 1 .  OFEV 100 MG CAPS, Take 1 tablet by mouth 2 (two) times daily. , Disp: , Rfl:  .  omeprazole (PRILOSEC) 20 MG capsule, Take 20 mg by mouth daily., Disp: , Rfl:  .  promethazine (PHENERGAN) 12.5 MG tablet, Take 1 tablet (12.5 mg total) by mouth every 6 (six) hours as needed for nausea or vomiting., Disp: 30 tablet, Rfl: 0 .  simvastatin (ZOCOR) 40 MG tablet, Take 40 mg by mouth daily., Disp: , Rfl:  .  traMADol (ULTRAM) 50 MG tablet, 3 (three) times daily. For cough by Dr. Garner Nash, Disp: , Rfl: 0

## 2016-11-10 NOTE — Assessment & Plan Note (Addendum)
Increase O2 to 3 L of oxygen continuously.

## 2016-11-10 NOTE — Assessment & Plan Note (Signed)
She has idiopathic pulmonary fibrosis. She has been tolerating the low-dose of Ofev without difficulty. Her liver function testing today shows no evidence of liver toxicity. She will need to continue to have her liver function test routinely. It should be noted that she was severely intolerant to Esbriet.  Overall her condition has worsened over the last 2 years.  Plan: Follow-up 3 months Repeat 6 minute walk and pulmonary function test in 6 months I told her today to hold off on exercising until she has been cleared by a neuro-stroke specialist.

## 2016-11-10 NOTE — Assessment & Plan Note (Signed)
She had transient left-sided weakness and dizziness a week ago which was not associated with a change in her respiratory symptoms. We will refer her to a neurologist for further evaluation as I'm concerned she may have had a TIA. She was counseled in the emergency room if this happens again. She was advised to continue taking aspirin daily.

## 2016-11-12 ENCOUNTER — Telehealth: Payer: Self-pay | Admitting: Pulmonary Disease

## 2016-11-12 NOTE — Telephone Encounter (Signed)
[id number] x1 

## 2016-11-13 DIAGNOSIS — Z1231 Encounter for screening mammogram for malignant neoplasm of breast: Secondary | ICD-10-CM | POA: Diagnosis not present

## 2016-11-13 NOTE — Telephone Encounter (Signed)
Noted  

## 2016-11-18 DIAGNOSIS — J84112 Idiopathic pulmonary fibrosis: Secondary | ICD-10-CM | POA: Diagnosis not present

## 2016-11-18 DIAGNOSIS — G459 Transient cerebral ischemic attack, unspecified: Secondary | ICD-10-CM | POA: Diagnosis not present

## 2016-11-18 DIAGNOSIS — J449 Chronic obstructive pulmonary disease, unspecified: Secondary | ICD-10-CM | POA: Diagnosis not present

## 2016-11-18 DIAGNOSIS — I2510 Atherosclerotic heart disease of native coronary artery without angina pectoris: Secondary | ICD-10-CM | POA: Diagnosis not present

## 2016-11-18 DIAGNOSIS — J111 Influenza due to unidentified influenza virus with other respiratory manifestations: Secondary | ICD-10-CM | POA: Diagnosis not present

## 2016-11-19 ENCOUNTER — Other Ambulatory Visit: Payer: Self-pay | Admitting: Neurology

## 2016-11-19 DIAGNOSIS — G459 Transient cerebral ischemic attack, unspecified: Secondary | ICD-10-CM

## 2016-11-19 DIAGNOSIS — I729 Aneurysm of unspecified site: Secondary | ICD-10-CM

## 2016-11-27 ENCOUNTER — Encounter: Payer: Self-pay | Admitting: Pulmonary Disease

## 2016-11-27 ENCOUNTER — Other Ambulatory Visit: Payer: Self-pay | Admitting: Pulmonary Disease

## 2016-11-27 NOTE — Telephone Encounter (Signed)
CY please advise as BQ worked 11pm Elink last night. Thanks.

## 2016-11-27 NOTE — Telephone Encounter (Signed)
If side pressure on nose doesn't stop the bleeding, then see an ENT or go to the ER or an Urgent Care.  We can order her DME to add a humidifier to her home O2 concentrator.  She can start using otc ""nasal saline gel" wiped gently into each nostril with a Q-tip or finget tip to protect the lining of the nose .

## 2016-12-05 DIAGNOSIS — R05 Cough: Secondary | ICD-10-CM | POA: Diagnosis not present

## 2016-12-05 DIAGNOSIS — J8410 Pulmonary fibrosis, unspecified: Secondary | ICD-10-CM | POA: Diagnosis not present

## 2016-12-05 DIAGNOSIS — I10 Essential (primary) hypertension: Secondary | ICD-10-CM | POA: Diagnosis not present

## 2016-12-09 ENCOUNTER — Ambulatory Visit (HOSPITAL_COMMUNITY)
Admission: RE | Admit: 2016-12-09 | Discharge: 2016-12-09 | Disposition: A | Discharge status: Home / Self Care | Payer: Medicare HMO | Source: Ambulatory Visit | Attending: Neurology | Admitting: Neurology

## 2016-12-09 DIAGNOSIS — I672 Cerebral atherosclerosis: Secondary | ICD-10-CM | POA: Insufficient documentation

## 2016-12-09 DIAGNOSIS — G459 Transient cerebral ischemic attack, unspecified: Secondary | ICD-10-CM

## 2016-12-09 DIAGNOSIS — I729 Aneurysm of unspecified site: Secondary | ICD-10-CM | POA: Diagnosis not present

## 2016-12-09 DIAGNOSIS — G319 Degenerative disease of nervous system, unspecified: Secondary | ICD-10-CM | POA: Insufficient documentation

## 2016-12-09 DIAGNOSIS — I739 Peripheral vascular disease, unspecified: Secondary | ICD-10-CM | POA: Diagnosis not present

## 2016-12-09 DIAGNOSIS — I6523 Occlusion and stenosis of bilateral carotid arteries: Secondary | ICD-10-CM | POA: Diagnosis not present

## 2016-12-09 DIAGNOSIS — R531 Weakness: Secondary | ICD-10-CM | POA: Diagnosis not present

## 2016-12-30 DIAGNOSIS — I2510 Atherosclerotic heart disease of native coronary artery without angina pectoris: Secondary | ICD-10-CM | POA: Diagnosis not present

## 2016-12-30 DIAGNOSIS — I6523 Occlusion and stenosis of bilateral carotid arteries: Secondary | ICD-10-CM | POA: Diagnosis not present

## 2016-12-30 DIAGNOSIS — G459 Transient cerebral ischemic attack, unspecified: Secondary | ICD-10-CM | POA: Diagnosis not present

## 2016-12-30 DIAGNOSIS — J449 Chronic obstructive pulmonary disease, unspecified: Secondary | ICD-10-CM | POA: Diagnosis not present

## 2016-12-30 DIAGNOSIS — E782 Mixed hyperlipidemia: Secondary | ICD-10-CM | POA: Diagnosis not present

## 2017-01-02 DIAGNOSIS — J8410 Pulmonary fibrosis, unspecified: Secondary | ICD-10-CM | POA: Diagnosis not present

## 2017-01-02 DIAGNOSIS — R05 Cough: Secondary | ICD-10-CM | POA: Diagnosis not present

## 2017-01-02 DIAGNOSIS — I10 Essential (primary) hypertension: Secondary | ICD-10-CM | POA: Diagnosis not present

## 2017-01-06 ENCOUNTER — Encounter: Payer: Self-pay | Admitting: Pulmonary Disease

## 2017-01-07 NOTE — Telephone Encounter (Signed)
Called and spoke to pt. Pt states her SOB has slowly been getting worse for the last month. Pt was told by an Ofev Nurse that there is a pill that will help her SOB. Advised pt that she is currently taking a pill, OFEV, for her current lung condition and her lung condition causes SOB. Advised maybe she misunderstood the nurse. Questioned pt if she wanted to be seen sooner, pt states she will just wait till her upcoming appt with BQ. Advised pt that if her SOB were to worsen at all or if she were to develop any other respiratory issues to call back. Pt verbalized understanding and denied any further questions or concerns at this time.    Will forward to BQ as FYI.

## 2017-01-19 DIAGNOSIS — E782 Mixed hyperlipidemia: Secondary | ICD-10-CM | POA: Diagnosis not present

## 2017-01-19 DIAGNOSIS — J449 Chronic obstructive pulmonary disease, unspecified: Secondary | ICD-10-CM | POA: Diagnosis not present

## 2017-01-19 DIAGNOSIS — K210 Gastro-esophageal reflux disease with esophagitis: Secondary | ICD-10-CM | POA: Diagnosis not present

## 2017-01-19 DIAGNOSIS — I10 Essential (primary) hypertension: Secondary | ICD-10-CM | POA: Diagnosis not present

## 2017-01-19 DIAGNOSIS — Z9189 Other specified personal risk factors, not elsewhere classified: Secondary | ICD-10-CM | POA: Diagnosis not present

## 2017-01-22 DIAGNOSIS — I25111 Atherosclerotic heart disease of native coronary artery with angina pectoris with documented spasm: Secondary | ICD-10-CM | POA: Diagnosis not present

## 2017-01-22 DIAGNOSIS — I10 Essential (primary) hypertension: Secondary | ICD-10-CM | POA: Diagnosis not present

## 2017-01-22 DIAGNOSIS — Z23 Encounter for immunization: Secondary | ICD-10-CM | POA: Diagnosis not present

## 2017-01-22 DIAGNOSIS — J849 Interstitial pulmonary disease, unspecified: Secondary | ICD-10-CM | POA: Diagnosis not present

## 2017-01-22 DIAGNOSIS — K219 Gastro-esophageal reflux disease without esophagitis: Secondary | ICD-10-CM | POA: Diagnosis not present

## 2017-01-22 DIAGNOSIS — E782 Mixed hyperlipidemia: Secondary | ICD-10-CM | POA: Diagnosis not present

## 2017-01-22 DIAGNOSIS — M1612 Unilateral primary osteoarthritis, left hip: Secondary | ICD-10-CM | POA: Diagnosis not present

## 2017-01-22 DIAGNOSIS — J301 Allergic rhinitis due to pollen: Secondary | ICD-10-CM | POA: Diagnosis not present

## 2017-01-30 ENCOUNTER — Other Ambulatory Visit: Payer: Self-pay | Admitting: Pulmonary Disease

## 2017-02-02 DIAGNOSIS — R05 Cough: Secondary | ICD-10-CM | POA: Diagnosis not present

## 2017-02-02 DIAGNOSIS — J8410 Pulmonary fibrosis, unspecified: Secondary | ICD-10-CM | POA: Diagnosis not present

## 2017-02-02 DIAGNOSIS — I10 Essential (primary) hypertension: Secondary | ICD-10-CM | POA: Diagnosis not present

## 2017-03-04 DIAGNOSIS — I10 Essential (primary) hypertension: Secondary | ICD-10-CM | POA: Diagnosis not present

## 2017-03-04 DIAGNOSIS — J8410 Pulmonary fibrosis, unspecified: Secondary | ICD-10-CM | POA: Diagnosis not present

## 2017-03-04 DIAGNOSIS — R05 Cough: Secondary | ICD-10-CM | POA: Diagnosis not present

## 2017-03-11 ENCOUNTER — Ambulatory Visit: Payer: Medicare HMO | Admitting: Pulmonary Disease

## 2017-03-23 ENCOUNTER — Telehealth: Payer: Self-pay | Admitting: Pulmonary Disease

## 2017-03-23 NOTE — Telephone Encounter (Signed)
Angelica with [employer], [phone].  Reference #[id number].  They have sent fax needing more information around 2:00 pm and state needing this information sent back as soon as possible.

## 2017-03-23 NOTE — Telephone Encounter (Signed)
We have not received this fax. I have left a message with [employer] to return our call.

## 2017-03-23 NOTE — Telephone Encounter (Signed)
A PA was initiated with [employer] [phone]) for megace with reference number [id number]. The decision will take between 24-72 hours.   Will route to [place] to follow up.

## 2017-03-25 NOTE — Telephone Encounter (Signed)
[facility] and notified that the pt does not have cancer, cystic fibrosis, or HIV  She states nothing further needed  Will send for clinical review [facility]

## 2017-03-25 NOTE — Telephone Encounter (Signed)
Shammara with [employer] calling about PA. CB is [phone], ref [id number]. Does the patient have cachexia associated with Aids, Cancer or cystic fibrosis.  Does the patient have cachexia associated with chronic disease, if so please specify.

## 2017-03-26 NOTE — Telephone Encounter (Signed)
Received a letter from [facility] that Megestrol has been approved until 03/26/2019.   Pharmacy is aware. Nothing further needed at time of call.

## 2017-04-04 DIAGNOSIS — R05 Cough: Secondary | ICD-10-CM | POA: Diagnosis not present

## 2017-04-04 DIAGNOSIS — I10 Essential (primary) hypertension: Secondary | ICD-10-CM | POA: Diagnosis not present

## 2017-04-04 DIAGNOSIS — J8410 Pulmonary fibrosis, unspecified: Secondary | ICD-10-CM | POA: Diagnosis not present

## 2017-04-07 ENCOUNTER — Ambulatory Visit: Payer: Medicare HMO | Admitting: Pulmonary Disease

## 2017-04-27 DIAGNOSIS — Z6820 Body mass index (BMI) 20.0-20.9, adult: Secondary | ICD-10-CM | POA: Diagnosis not present

## 2017-04-27 DIAGNOSIS — Z23 Encounter for immunization: Secondary | ICD-10-CM | POA: Diagnosis not present

## 2017-04-27 DIAGNOSIS — N3090 Cystitis, unspecified without hematuria: Secondary | ICD-10-CM | POA: Diagnosis not present

## 2017-05-04 DIAGNOSIS — R05 Cough: Secondary | ICD-10-CM | POA: Diagnosis not present

## 2017-05-04 DIAGNOSIS — I10 Essential (primary) hypertension: Secondary | ICD-10-CM | POA: Diagnosis not present

## 2017-05-04 DIAGNOSIS — J8410 Pulmonary fibrosis, unspecified: Secondary | ICD-10-CM | POA: Diagnosis not present

## 2017-05-05 DIAGNOSIS — R634 Abnormal weight loss: Secondary | ICD-10-CM | POA: Diagnosis not present

## 2017-05-05 DIAGNOSIS — J449 Chronic obstructive pulmonary disease, unspecified: Secondary | ICD-10-CM | POA: Diagnosis not present

## 2017-05-05 DIAGNOSIS — A09 Infectious gastroenteritis and colitis, unspecified: Secondary | ICD-10-CM | POA: Diagnosis not present

## 2017-05-05 DIAGNOSIS — Z6820 Body mass index (BMI) 20.0-20.9, adult: Secondary | ICD-10-CM | POA: Diagnosis not present

## 2017-05-06 ENCOUNTER — Encounter: Payer: Self-pay | Admitting: Pulmonary Disease

## 2017-05-06 DIAGNOSIS — R197 Diarrhea, unspecified: Secondary | ICD-10-CM | POA: Diagnosis not present

## 2017-05-09 IMAGING — MR MR HEAD W/O CM
10 of 13 series · 41 of 48 positions shown · non-contrast
Comparison: CT head 04/19/2016.

CLINICAL DATA: Weakness and confusion for 1 month. Transient
cerebral ischemia of unspecified type.

EXAM:
MRI HEAD WITHOUT CONTRAST
MRA HEAD WITHOUT CONTRAST
TECHNIQUE: Multiplanar, multiecho pulse sequences of the brain and surrounding
structures were obtained without intravenous contrast. Angiographic
images of the head were obtained using MRA technique without
contrast.

[Series 4: DWI · axial · 3.0mm · 0.82mm/px · z∈[-98,+49]mm · 5 of 50 slices shown (1 of 6)]
[im 1/50]
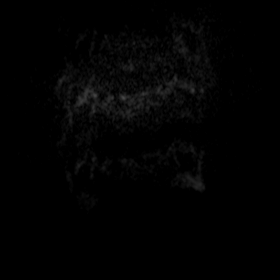
[im 13/50]
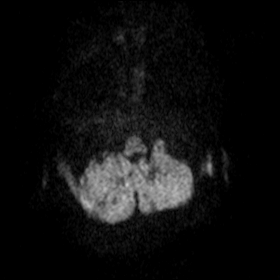
[im 25/50]
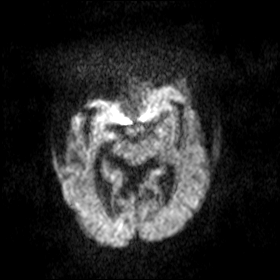
[im 37/50]
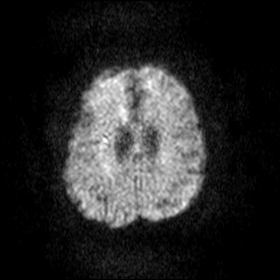
[im 50/50]
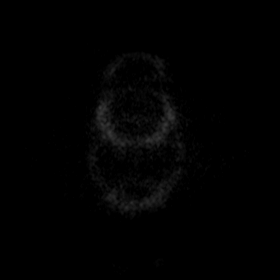

[Series 5: DWI · axial · 3.0mm · 0.82mm/px · z∈[-98,+49]mm · 5 of 50 slices shown (2 of 6)]
[im 1/50]
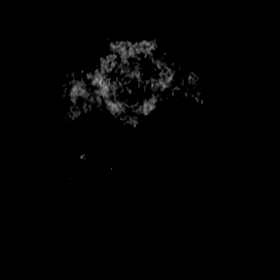
[im 13/50]
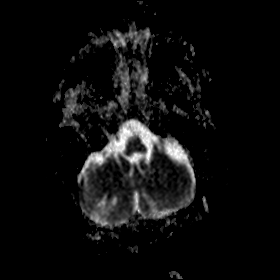
[im 25/50]
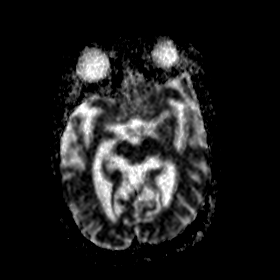
[im 37/50]
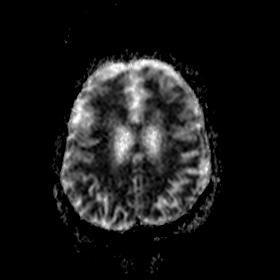
[im 50/50]
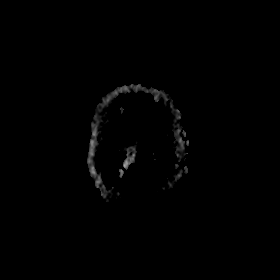

[Series 6: DWI · coronal · 5.0mm · 0.51mm/px · 3 of 36 slices shown (3 of 6)]
[im 1/36]
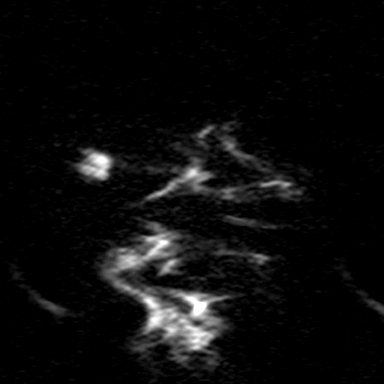
[im 18/36]
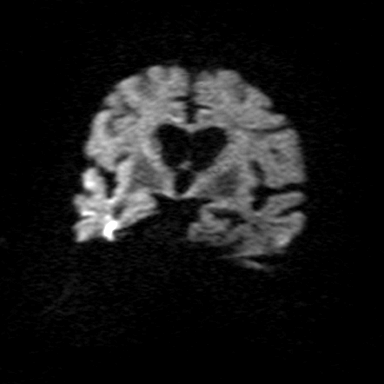
[im 36/36]
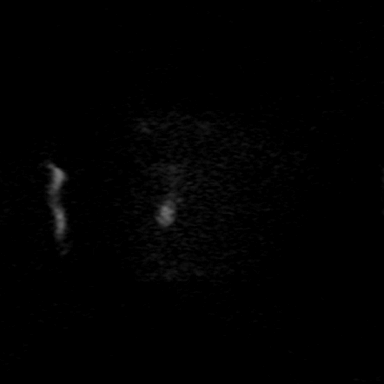

[Series 7: DWI · coronal · 5.0mm · 0.51mm/px · 3 of 34 slices shown (4 of 6)]
[im 1/34]
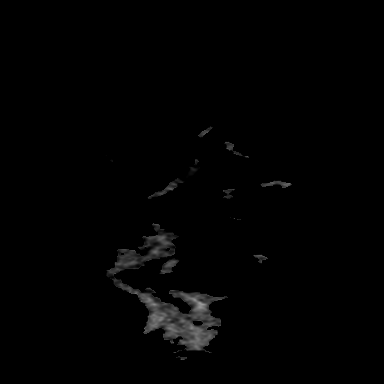
[im 17/34]
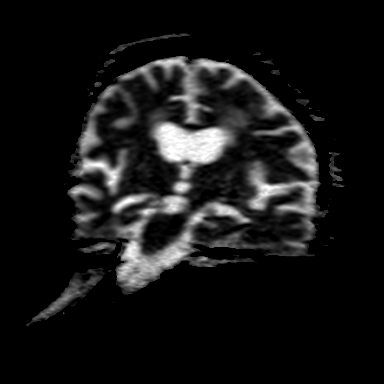
[im 34/34]
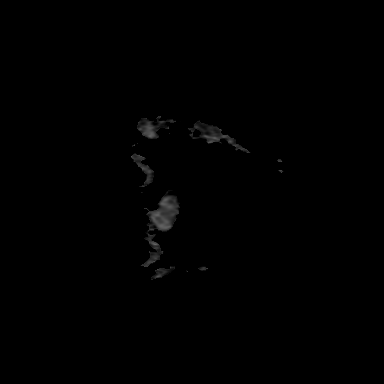

[Series 9: T2 · axial · 5.0mm · 0.75mm/px · z∈[-91,+51]mm · 2 of 23 slices shown (1 of 2)]
[im 1/23]
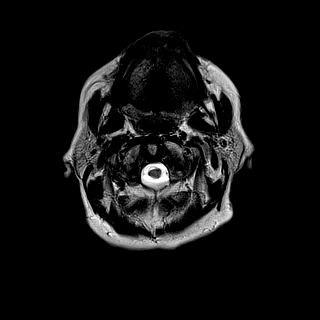
[im 23/23]
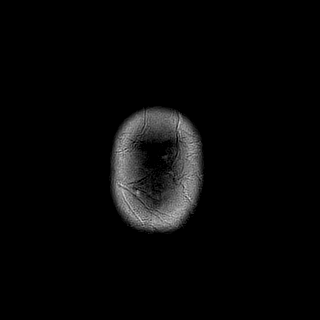

[Series 10: FLAIR · axial · 5.0mm · 0.94mm/px · z∈[-91,+51]mm · 2 of 23 slices shown]
[im 1/23]
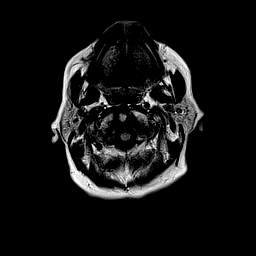
[im 23/23]
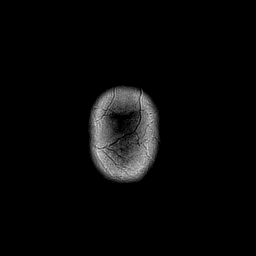

[Series 11: T1 · axial · 2.0mm · 0.47mm/px · z∈[-108,+77]mm · 8 of 94 slices shown]
[im 1/94]
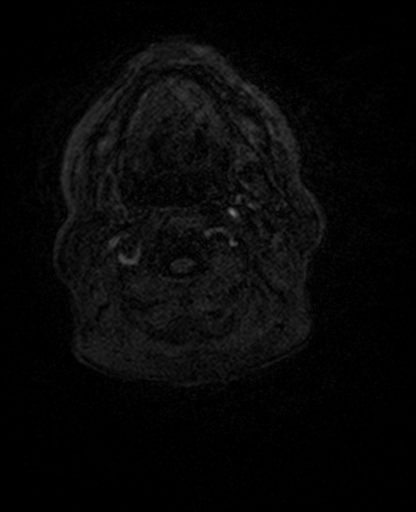
[im 12/94]
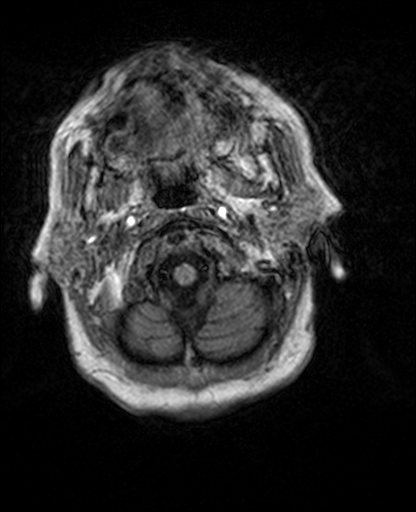
[im 24/94]
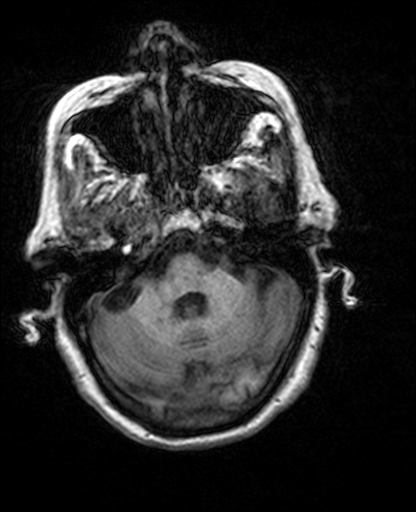
[im 35/94]
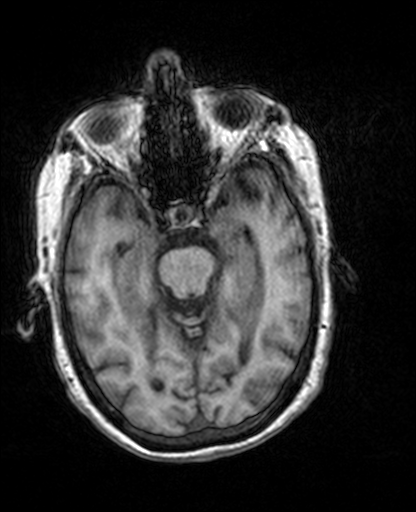
[im 59/94]
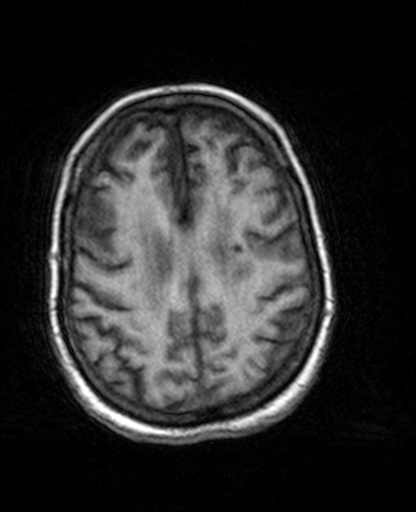
[im 70/94]
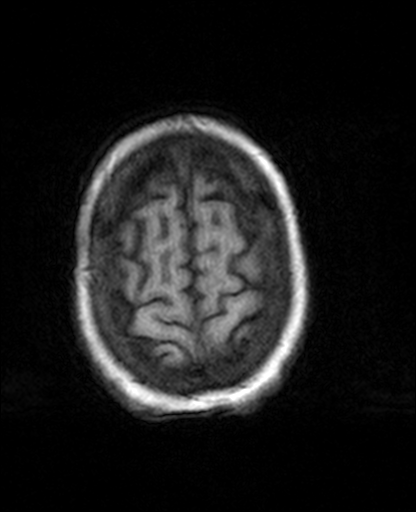
[im 82/94]
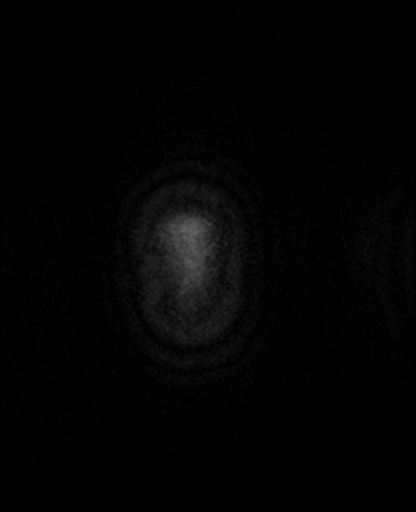
[im 94/94]
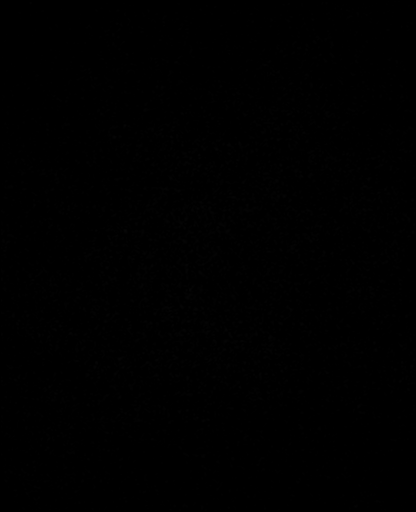

[Series 13: T2 · coronal · 5.0mm · 0.75mm/px · 3 of 28 slices shown (2 of 2)]
[im 1/28]
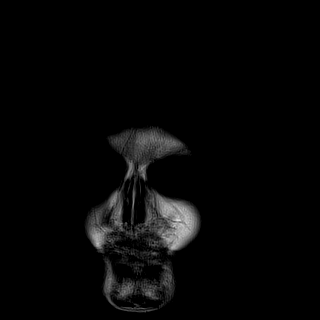
[im 14/28]
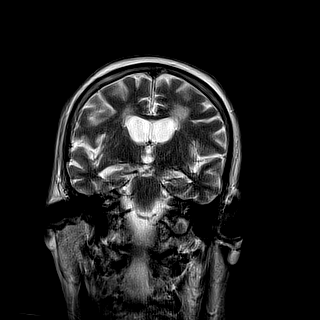
[im 28/28]
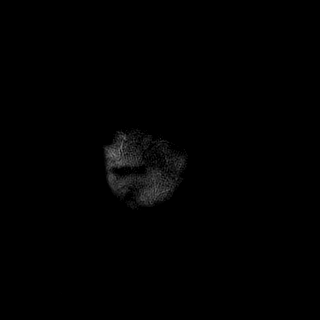

[Series 15: DWI · axial · 3.0mm · 0.82mm/px · z∈[-98,+49]mm · 5 of 50 slices shown (5 of 6)]
[im 1/50]
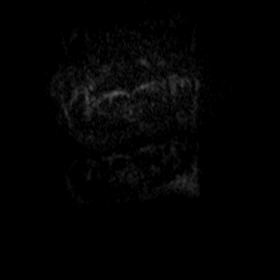
[im 13/50]
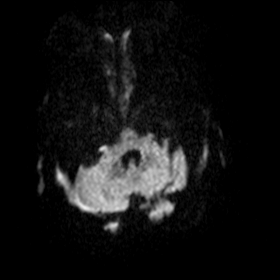
[im 25/50]
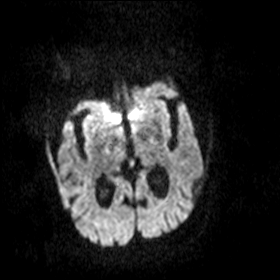
[im 37/50]
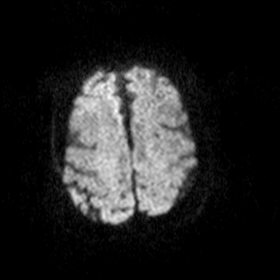
[im 50/50]
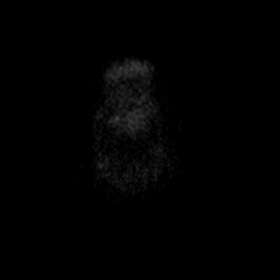

[Series 16: DWI · axial · 3.0mm · 0.82mm/px · z∈[-98,+49]mm · 5 of 50 slices shown (6 of 6)]
[im 1/50]
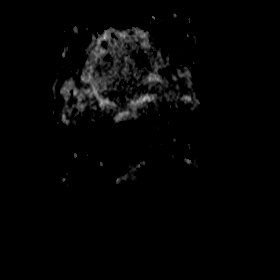
[im 13/50]
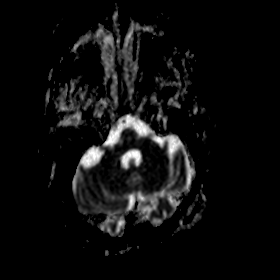
[im 25/50]
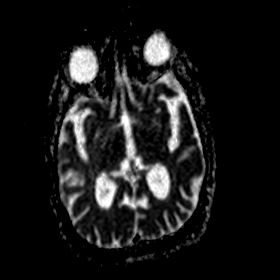
[im 37/50]
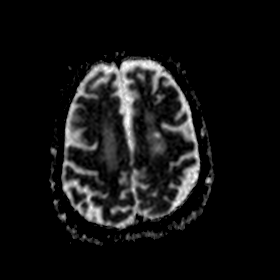
[im 50/50]
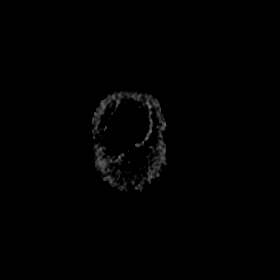

[41 of 48 positions shown; findings below may reference images not displayed]

FINDINGS: The patient was unable to remain motionless for the exam. Small or
subtle lesions could be overlooked.

MRI HEAD FINDINGS

Brain: No evidence for acute infarction, hemorrhage, mass lesion, or
extra-axial fluid.

Advanced atrophy. Hydrocephalus ex vacuo. Extensive T2 and FLAIR
hyperintensities throughout the white matter, involving the
brainstem as well, consistent with chronic microvascular ischemic
change. Chronic cerebellar infarcts. Chronic LEFT centrum semiovale
deep white matter infarct.

Vascular: Flow voids are maintained.

Skull and upper cervical spine: Normal marrow signal.

Sinuses/Orbits: No layering fluid or orbital masses.

Other: Compared with prior CT, similar appearance.

MRA HEAD FINDINGS

The cervical and petrous segments of the internal carotid arteries
are widely patent. Both cavernous segments appear patent as well.
There are 50-75% stenoses involving the supraclinoid ICA segments
bilaterally.

Moderately diseased RIGHT A1 anterior cerebral artery, suspected
50-75% stenosis. No similar lesion on the LEFT.

BILATERAL M1 MCA trunks are widely patent. Moderate irregularity of
the M2 and M3 branches suggesting intracranial atherosclerotic
disease.

Basilar artery widely patent with vertebrals codominant. Severe
stenosis RIGHT posterior cerebral artery P1 segment. No similar P1
stenosis on the LEFT. Severe narrowing of the P2 and P3 segments of
both posterior cerebral arteries.

No definite cerebellar branch occlusion.

No saccular aneurysm.
IMPRESSION: Atrophy and small vessel disease.  No acute intracranial findings.

Intracranial atherosclerotic disease as described. Potentially flow
reducing lesions of the BILATERAL supraclinoid ICA segments, RIGHT
anterior cerebral artery, distal MCA branches, and BILATERAL
posterior cerebral arteries.

## 2017-05-15 ENCOUNTER — Encounter: Payer: Self-pay | Admitting: Pulmonary Disease

## 2017-05-15 ENCOUNTER — Ambulatory Visit (INDEPENDENT_AMBULATORY_CARE_PROVIDER_SITE_OTHER): Payer: Medicare HMO | Admitting: Pulmonary Disease

## 2017-05-15 VITALS — BP 126/70 | HR 112 | Ht 65.0 in | Wt 114.4 lb

## 2017-05-15 DIAGNOSIS — E860 Dehydration: Secondary | ICD-10-CM | POA: Diagnosis not present

## 2017-05-15 DIAGNOSIS — R112 Nausea with vomiting, unspecified: Secondary | ICD-10-CM | POA: Diagnosis not present

## 2017-05-15 DIAGNOSIS — I2510 Atherosclerotic heart disease of native coronary artery without angina pectoris: Secondary | ICD-10-CM | POA: Diagnosis not present

## 2017-05-15 DIAGNOSIS — R319 Hematuria, unspecified: Secondary | ICD-10-CM | POA: Diagnosis not present

## 2017-05-15 DIAGNOSIS — J84112 Idiopathic pulmonary fibrosis: Secondary | ICD-10-CM

## 2017-05-15 DIAGNOSIS — N3001 Acute cystitis with hematuria: Secondary | ICD-10-CM | POA: Diagnosis not present

## 2017-05-15 DIAGNOSIS — J9611 Chronic respiratory failure with hypoxia: Secondary | ICD-10-CM | POA: Diagnosis not present

## 2017-05-15 DIAGNOSIS — I252 Old myocardial infarction: Secondary | ICD-10-CM | POA: Diagnosis not present

## 2017-05-15 DIAGNOSIS — Z7982 Long term (current) use of aspirin: Secondary | ICD-10-CM | POA: Diagnosis not present

## 2017-05-15 DIAGNOSIS — Z955 Presence of coronary angioplasty implant and graft: Secondary | ICD-10-CM | POA: Diagnosis not present

## 2017-05-15 DIAGNOSIS — N390 Urinary tract infection, site not specified: Secondary | ICD-10-CM | POA: Diagnosis not present

## 2017-05-15 DIAGNOSIS — R531 Weakness: Secondary | ICD-10-CM | POA: Diagnosis not present

## 2017-05-15 DIAGNOSIS — Z79899 Other long term (current) drug therapy: Secondary | ICD-10-CM | POA: Diagnosis not present

## 2017-05-15 NOTE — Progress Notes (Signed)
Subjective:    Patient ID: [name], female    DOB: May 05, 1941, 76 y.o.   MRN: [id number]  Synopsis: Former [facility] patient with fibrosis Episode of FPARDS 2004, +exposure to macrodantin (but not on chronically)   HPI Chief Complaint  Patient presents with  . Follow-up    pt c/o worsening sob with any exertion, nausea, frequent vomiting.  Pt is not currently taking Ofev.     [name] has been complaining of weight loss and anorexia percent per months. Chest and it's increasing shortness of breath. She feels like her anorexia and nausea have been slowly worsening.  She feels nauseated right now.  She stopped the Ofev last week but her nausea hasn't changed.  She saw her PCP last week for diarrhea (3-4 times per day) who checked her stool sample and apparently all tests were normal.  She has not had a bowel movement in 3-4 days.  She is not passing gas right now.  No pain in her belly.  She last ate about an hour ago.  She was able to keep down a meal yesterday.    She says that her breathing has been getting worse has ben getting worsea swell.    No fever, no chills.  No dysuria.    Past Medical History:  Diagnosis Date  . Allergic rhinitis   . CAD (coronary artery disease)    HepaCoat stent to proximal RCA 2004 with acute MI  . Essential hypertension   . GERD (gastroesophageal reflux disease)   . History of acute inferior wall MI    Associated with RV infarct and cardiogenic shock 2004   . Hyperlipidemia   . Idiopathic pulmonary fibrosis (HCC)    Followed by Dr. Marchelle Gearingamaswamy      Review of Systems  Constitutional: Negative for diaphoresis, fatigue and fever.  HENT: Negative for postnasal drip, rhinorrhea and sinus pressure.   Respiratory: Positive for cough and shortness of breath. Negative for wheezing.   Cardiovascular: Negative for chest pain, palpitations and leg swelling.       Objective:   Physical Exam Vitals:   05/15/17 1352  BP: 126/70  Pulse: (!) 112    SpO2: 90%  Weight: 114 lb 6.4 oz (51.9 kg)  Height: 5\' 5"  (1.651 m)   3L   Gen: acutely ill appearing HENT: OP clear, TM's clear, neck supple PULM: CTA B, normal percussion CV: Tachycardia, no mgr, trace edema GI: BS+, soft, nontender Derm: no cyanosis or rash Psyche: normal mood and affect   Labs: Autoimmune labwork 10/2014:  Negative  Chest imaging: CT chest 08/2014:  Volume loss left hemithorax, fibrotic changes L > R, subpleural reticulation with a few areas of honeycombing.  (progressive by CXR from 2009) Barium swallow 2016:  +esophageal dysmotility and +aspiration noted. >> referred to speech.  04/2015 Modified barium swallow: no clear aspiration 04/2015 CT chest (HRCT) > worsening pulmonary fibrosis changes in bases L>R consistent with UIP (Entrikin read)  PFT PFT"s 2009:  No obstruction, TLC 3.23 (70%), DLCO 9.4 (50%) PFT"s 2016:  No obstruction, TLC 2.47 (50%), DLCO 7.90 (34%) July 2017 lung function testing ratio 88%, FVC 1.36 L (50% predicted), total lung capacity 2.63 L (53% predicted), DLCO 5.21 (22% predicted). 11/10/2016 FVC 1.26 L 47% predicted, FEV1 1.0 L 50% predicted. Total lung capacity 2.25 L 45% predicted, DLCO 5. 5.58 24 percent predicted  Records rewiewed in card everywhere, no recent labs available for review      Assessment & Plan:  UIP (usual interstitial pneumonitis) (HCC)  Chronic hypoxemic respiratory failure (HCC)  Nausea and vomiting, intractability of vomiting not specified, unspecified vomiting type  Discussion: Mr. has not been doing well recently. She's had a lot of diarrhea in the last week but unfortunately for the last 2-3 days she's had no bowel movements with significant amount of nausea and vomiting. Normally this would make me concerned about the possibility of a small bowel obstruction but she does have bowel sounds today. Regardless, she is volume depleted and tachycardic today and somewhat to. She needs to go the emergency room  for lab work including liver function testing to evaluate for toxicity from Ofev. She also used to have an abdominal x-ray. I suspect she'll need to be admitted and receive IV fluids and antinausea medicines.  After she returns from the hospital we can see her again and resume surveillance testing with lung function testing in 6 minute walk test to evaluate her pulmonary fibrosis. Difficult for me to comment on the progression today given her acute illness.  I called her PCP today to let them know that I am advising she go to the emergency room.  Plan:  For your nausea and vomiting: You need to go to the emergency department right away for an abdominal x-ray, IV fluids, and anti-nausea medicines  For your pulmonary fibrosis: On the next visit we will get liver function testing Hold Ofev for now, resume after the next visit with us  For your chronic respiratory failure with hypoxemia: Keep using oxygen 3 L/m  We will see you back in 2-3 weeks with a NP or sooner if needed    Current Outpatient Prescriptions:  .  aspirin 81 MG tablet, Take 81 mg by mouth daily., Disp: , Rfl:  .  desmopressin (DDAVP) 0.2 MG tablet, Take 1 tablet by mouth at bedtime., Disp: , Rfl: 0 .  megestrol (MEGACE) 40 MG/ML suspension, take 12 milliliters by mouth once daily, Disp: 360 mL, Rfl: 2 .  metoprolol succinate (TOPROL-XL) 50 MG 24 hr tablet, Take 50 mg by mouth daily. Take with or immediately following a meal., Disp: , Rfl:  .  mirtazapine (REMERON) 15 MG tablet, Take 15 mg by mouth at bedtime., Disp: , Rfl:  .  Multiple Vitamin (MULTIVITAMIN) tablet, Take 1 tablet by mouth daily., Disp: , Rfl:  .  MYRBETRIQ 50 MG TB24 tablet, Take 1 tablet by mouth daily., Disp: , Rfl: 1 .  omeprazole (PRILOSEC) 20 MG capsule, Take 20 mg by mouth daily., Disp: , Rfl:  .  ondansetron (ZOFRAN) 4 MG tablet, take 1 tablet by mouth every 6 hours if needed for nausea and vomiting, Disp: 45 tablet, Rfl: 2 .  promethazine  (PHENERGAN) 12.5 MG tablet, Take 1 tablet (12.5 mg total) by mouth every 6 (six) hours as needed for nausea or vomiting., Disp: 30 tablet, Rfl: 0 .  simvastatin (ZOCOR) 40 MG tablet, Take 40 mg by mouth daily., Disp: , Rfl:  .  traMADol (ULTRAM) 50 MG tablet, 3 (three) times daily. For cough by Dr. Garner Nashaniels, Disp: , Rfl: 0 .  OFEV 100 MG CAPS, Take 1 tablet by mouth 2 (two) times daily. , Disp: , Rfl:

## 2017-05-15 NOTE — Patient Instructions (Signed)
For your nausea and vomiting: You need to go to the emergency department right away for an abdominal x-ray, IV fluids, and anti-nausea medicines  For your pulmonary fibrosis: On the next visit we will get liver function testing Hold Ofev for now, resume after the next visit with us  For your chronic respiratory failure with hypoxemia: Keep using oxygen 3 L/m  We will see you back in 2-3 weeks with a NP or sooner if needed

## 2017-05-18 DIAGNOSIS — B9620 Unspecified Escherichia coli [E. coli] as the cause of diseases classified elsewhere: Secondary | ICD-10-CM | POA: Diagnosis not present

## 2017-05-18 DIAGNOSIS — R1110 Vomiting, unspecified: Secondary | ICD-10-CM | POA: Diagnosis not present

## 2017-05-18 DIAGNOSIS — Z681 Body mass index (BMI) 19 or less, adult: Secondary | ICD-10-CM | POA: Diagnosis not present

## 2017-05-18 DIAGNOSIS — N136 Pyonephrosis: Secondary | ICD-10-CM | POA: Diagnosis not present

## 2017-05-18 DIAGNOSIS — R64 Cachexia: Secondary | ICD-10-CM | POA: Diagnosis not present

## 2017-05-18 DIAGNOSIS — F339 Major depressive disorder, recurrent, unspecified: Secondary | ICD-10-CM | POA: Diagnosis not present

## 2017-05-18 DIAGNOSIS — E871 Hypo-osmolality and hyponatremia: Secondary | ICD-10-CM | POA: Diagnosis not present

## 2017-05-18 DIAGNOSIS — J8410 Pulmonary fibrosis, unspecified: Secondary | ICD-10-CM | POA: Diagnosis not present

## 2017-05-18 DIAGNOSIS — I2510 Atherosclerotic heart disease of native coronary artery without angina pectoris: Secondary | ICD-10-CM | POA: Diagnosis not present

## 2017-05-18 DIAGNOSIS — N200 Calculus of kidney: Secondary | ICD-10-CM | POA: Diagnosis not present

## 2017-05-18 DIAGNOSIS — N390 Urinary tract infection, site not specified: Secondary | ICD-10-CM | POA: Diagnosis not present

## 2017-05-18 DIAGNOSIS — E876 Hypokalemia: Secondary | ICD-10-CM | POA: Diagnosis not present

## 2017-05-18 DIAGNOSIS — I252 Old myocardial infarction: Secondary | ICD-10-CM | POA: Diagnosis not present

## 2017-05-18 DIAGNOSIS — R109 Unspecified abdominal pain: Secondary | ICD-10-CM | POA: Diagnosis not present

## 2017-05-19 DIAGNOSIS — E876 Hypokalemia: Secondary | ICD-10-CM | POA: Diagnosis not present

## 2017-05-19 DIAGNOSIS — N390 Urinary tract infection, site not specified: Secondary | ICD-10-CM | POA: Diagnosis not present

## 2017-05-22 ENCOUNTER — Inpatient Hospital Stay (HOSPITAL_COMMUNITY)
Admission: AD | Admit: 2017-05-22 | Payer: Self-pay | Source: Other Acute Inpatient Hospital | Admitting: Internal Medicine

## 2017-05-22 ENCOUNTER — Inpatient Hospital Stay (HOSPITAL_COMMUNITY)
Admission: AD | Admit: 2017-05-22 | Discharge: 2017-05-24 | DRG: 690 | Disposition: A | Discharge status: Home / Self Care | Payer: Medicare HMO | Source: Other Acute Inpatient Hospital | Attending: Family Medicine | Admitting: Family Medicine

## 2017-05-22 ENCOUNTER — Encounter (HOSPITAL_COMMUNITY): Payer: Self-pay

## 2017-05-22 DIAGNOSIS — Z885 Allergy status to narcotic agent status: Secondary | ICD-10-CM | POA: Diagnosis not present

## 2017-05-22 DIAGNOSIS — R112 Nausea with vomiting, unspecified: Secondary | ICD-10-CM | POA: Diagnosis not present

## 2017-05-22 DIAGNOSIS — Z888 Allergy status to other drugs, medicaments and biological substances status: Secondary | ICD-10-CM | POA: Diagnosis not present

## 2017-05-22 DIAGNOSIS — R05 Cough: Secondary | ICD-10-CM | POA: Diagnosis not present

## 2017-05-22 DIAGNOSIS — Z8673 Personal history of transient ischemic attack (TIA), and cerebral infarction without residual deficits: Secondary | ICD-10-CM | POA: Diagnosis not present

## 2017-05-22 DIAGNOSIS — I10 Essential (primary) hypertension: Secondary | ICD-10-CM | POA: Diagnosis not present

## 2017-05-22 DIAGNOSIS — Z87891 Personal history of nicotine dependence: Secondary | ICD-10-CM | POA: Diagnosis not present

## 2017-05-22 DIAGNOSIS — N136 Pyonephrosis: Secondary | ICD-10-CM | POA: Diagnosis not present

## 2017-05-22 DIAGNOSIS — Z955 Presence of coronary angioplasty implant and graft: Secondary | ICD-10-CM | POA: Diagnosis not present

## 2017-05-22 DIAGNOSIS — Z1623 Resistance to quinolones and fluoroquinolones: Secondary | ICD-10-CM | POA: Diagnosis present

## 2017-05-22 DIAGNOSIS — Z87442 Personal history of urinary calculi: Secondary | ICD-10-CM | POA: Diagnosis not present

## 2017-05-22 DIAGNOSIS — Z90710 Acquired absence of both cervix and uterus: Secondary | ICD-10-CM

## 2017-05-22 DIAGNOSIS — J84112 Idiopathic pulmonary fibrosis: Secondary | ICD-10-CM | POA: Diagnosis not present

## 2017-05-22 DIAGNOSIS — I2510 Atherosclerotic heart disease of native coronary artery without angina pectoris: Secondary | ICD-10-CM | POA: Diagnosis present

## 2017-05-22 DIAGNOSIS — Z7982 Long term (current) use of aspirin: Secondary | ICD-10-CM | POA: Diagnosis not present

## 2017-05-22 DIAGNOSIS — N390 Urinary tract infection, site not specified: Secondary | ICD-10-CM

## 2017-05-22 DIAGNOSIS — I252 Old myocardial infarction: Secondary | ICD-10-CM

## 2017-05-22 DIAGNOSIS — G4700 Insomnia, unspecified: Secondary | ICD-10-CM | POA: Diagnosis present

## 2017-05-22 DIAGNOSIS — K219 Gastro-esophageal reflux disease without esophagitis: Secondary | ICD-10-CM | POA: Diagnosis present

## 2017-05-22 DIAGNOSIS — E785 Hyperlipidemia, unspecified: Secondary | ICD-10-CM | POA: Diagnosis present

## 2017-05-22 DIAGNOSIS — Z882 Allergy status to sulfonamides status: Secondary | ICD-10-CM

## 2017-05-22 DIAGNOSIS — J9611 Chronic respiratory failure with hypoxia: Secondary | ICD-10-CM | POA: Diagnosis present

## 2017-05-22 DIAGNOSIS — Z825 Family history of asthma and other chronic lower respiratory diseases: Secondary | ICD-10-CM

## 2017-05-22 DIAGNOSIS — D72829 Elevated white blood cell count, unspecified: Secondary | ICD-10-CM | POA: Diagnosis not present

## 2017-05-22 DIAGNOSIS — B9620 Unspecified Escherichia coli [E. coli] as the cause of diseases classified elsewhere: Secondary | ICD-10-CM | POA: Diagnosis not present

## 2017-05-22 DIAGNOSIS — Z800 Family history of malignant neoplasm of digestive organs: Secondary | ICD-10-CM

## 2017-05-22 DIAGNOSIS — N132 Hydronephrosis with renal and ureteral calculous obstruction: Secondary | ICD-10-CM | POA: Diagnosis not present

## 2017-05-22 DIAGNOSIS — Z880 Allergy status to penicillin: Secondary | ICD-10-CM | POA: Diagnosis not present

## 2017-05-22 DIAGNOSIS — E876 Hypokalemia: Secondary | ICD-10-CM | POA: Diagnosis not present

## 2017-05-22 DIAGNOSIS — N1330 Unspecified hydronephrosis: Secondary | ICD-10-CM

## 2017-05-22 DIAGNOSIS — N200 Calculus of kidney: Secondary | ICD-10-CM | POA: Diagnosis not present

## 2017-05-22 LAB — CBC
HCT: 34 % — ABNORMAL LOW (ref 36.0–46.0)
Hemoglobin: 11.6 g/dL — ABNORMAL LOW (ref 12.0–15.0)
MCH: 30.3 pg (ref 26.0–34.0)
MCHC: 34.1 g/dL (ref 30.0–36.0)
MCV: 88.8 fL (ref 78.0–100.0)
PLATELETS: 266 10*3/uL (ref 150–400)
RBC: 3.83 MIL/uL — AB (ref 3.87–5.11)
RDW: 13.9 % (ref 11.5–15.5)
WBC: 21.3 10*3/uL — AB (ref 4.0–10.5)

## 2017-05-22 LAB — COMPREHENSIVE METABOLIC PANEL
ALK PHOS: 85 U/L (ref 38–126)
ALT: 15 U/L (ref 14–54)
AST: 25 U/L (ref 15–41)
Albumin: 3 g/dL — ABNORMAL LOW (ref 3.5–5.0)
Anion gap: 8 (ref 5–15)
BILIRUBIN TOTAL: 0.7 mg/dL (ref 0.3–1.2)
BUN: 6 mg/dL (ref 6–20)
CALCIUM: 8.7 mg/dL — AB (ref 8.9–10.3)
CHLORIDE: 94 mmol/L — AB (ref 101–111)
CO2: 26 mmol/L (ref 22–32)
CREATININE: 0.63 mg/dL (ref 0.44–1.00)
GFR calc non Af Amer: >60 mL/min (ref >60)
Glucose, Bld: 91 mg/dL (ref 65–99)
Potassium: 4.4 mmol/L (ref 3.5–5.1)
Sodium: 128 mmol/L — ABNORMAL LOW (ref 135–145)
TOTAL PROTEIN: 6.6 g/dL (ref 6.5–8.1)

## 2017-05-22 LAB — PROTIME-INR
INR: 1.14
PROTHROMBIN TIME: 14.7 s (ref 11.4–15.2)

## 2017-05-22 MED ORDER — SIMVASTATIN 40 MG PO TABS
40.0000 mg | ORAL_TABLET | Freq: Every day | ORAL | Status: DC
  Administered 2017-05-24: 40 mg via ORAL
  Filled 2017-05-22: qty 1

## 2017-05-22 MED ORDER — ONDANSETRON HCL 4 MG/2ML IJ SOLN
4.0000 mg | Freq: Four times a day (QID) | INTRAMUSCULAR | Status: DC | PRN
Start: 2017-05-22 — End: 2017-05-24

## 2017-05-22 MED ORDER — MIRABEGRON ER 25 MG PO TB24
50.0000 mg | ORAL_TABLET | Freq: Every day | ORAL | Status: DC
  Administered 2017-05-22 – 2017-05-24 (×2): 50 mg via ORAL
  Filled 2017-05-22 (×3): qty 2

## 2017-05-22 MED ORDER — TRAMADOL HCL 50 MG PO TABS
50.0000 mg | ORAL_TABLET | Freq: Four times a day (QID) | ORAL | Status: DC | PRN
  Administered 2017-05-23 (×2): 50 mg via ORAL
  Filled 2017-05-22 (×2): qty 1

## 2017-05-22 MED ORDER — POLYETHYLENE GLYCOL 3350 17 G PO PACK: 17.0000 g | PACK | Freq: Every day | ORAL | Status: DC | PRN

## 2017-05-22 MED ORDER — BOOST / RESOURCE BREEZE PO LIQD
1.0000 | Freq: Three times a day (TID) | ORAL | Status: DC
  Administered 2017-05-23 – 2017-05-24 (×2): 1 via ORAL

## 2017-05-22 MED ORDER — METOPROLOL SUCCINATE ER 50 MG PO TB24
50.0000 mg | ORAL_TABLET | Freq: Every day | ORAL | Status: DC
  Administered 2017-05-22 – 2017-05-24 (×2): 50 mg via ORAL
  Filled 2017-05-22 (×2): qty 1

## 2017-05-22 MED ORDER — DEXTROSE 5 % IV SOLN
1.0000 g | Freq: Two times a day (BID) | INTRAVENOUS | Status: DC
  Administered 2017-05-22 – 2017-05-24 (×4): 1 g via INTRAVENOUS
  Filled 2017-05-22 (×6): qty 1

## 2017-05-22 MED ORDER — DESMOPRESSIN ACETATE 0.1 MG PO TABS
200.0000 ug | ORAL_TABLET | Freq: Every day | ORAL | Status: DC
  Administered 2017-05-22 – 2017-05-23 (×2): 200 ug via ORAL
  Filled 2017-05-22: qty 2
  Filled 2017-05-22: qty 1
  Filled 2017-05-22 (×2): qty 2

## 2017-05-22 MED ORDER — SODIUM CHLORIDE 0.9 % IV SOLN
INTRAVENOUS | Status: DC
  Administered 2017-05-22 – 2017-05-24 (×4): via INTRAVENOUS

## 2017-05-22 MED ORDER — DOCUSATE SODIUM 100 MG PO CAPS
100.0000 mg | ORAL_CAPSULE | Freq: Two times a day (BID) | ORAL | Status: DC
  Administered 2017-05-22 – 2017-05-24 (×3): 100 mg via ORAL
  Filled 2017-05-22 (×3): qty 1

## 2017-05-22 MED ORDER — ONDANSETRON HCL 4 MG PO TABS
4.0000 mg | ORAL_TABLET | Freq: Four times a day (QID) | ORAL | Status: DC | PRN
  Administered 2017-05-23: 4 mg via ORAL
  Filled 2017-05-22: qty 1

## 2017-05-22 MED ORDER — ASPIRIN EC 81 MG PO TBEC
162.0000 mg | DELAYED_RELEASE_TABLET | Freq: Every day | ORAL | Status: DC
  Administered 2017-05-24: 162 mg via ORAL
  Filled 2017-05-22: qty 2

## 2017-05-22 MED ORDER — PANTOPRAZOLE SODIUM 40 MG PO TBEC
40.0000 mg | DELAYED_RELEASE_TABLET | Freq: Every day | ORAL | Status: DC
  Administered 2017-05-24: 40 mg via ORAL
  Filled 2017-05-22: qty 1

## 2017-05-22 NOTE — H&P (Signed)
[facility]  History and Physical [name] L. Link Snuffer, MD Pager [phone] (if 7P to 7A, page night hospitalist on [email or web address] KIEVIT Hansen:[id number] DOB: [birth year] DOA: 05/22/2017  Referring physician: [facility] [place]  PCP: Richardean Chimera, MD   Chief Complaint: hydronephritis w/ 8mm R UPJ stone and E coli UTI   HPI:  Pt is transfer from [facility] . Original admit was 05/15/17. She was being treated for continual abdominal discomfort , intolerance to PO for over a week. Found to have e coli UTI w/ WBC of 17.5 (unclear if this is initial lab or later in admission prior to transfer) at one point . She was treated w/ IVF and antibiotic therapy. CT performed showed 8mm stone at R UPJ and hydronephrosis . CT is on CD on chart for review. Transfer initiated b/c urology not available at the prior hospital, consultation needed. Accepting MD of [facility] was informed that urology was made aware of this pending transfer.   Of note, transfer was supposed to occur on 8/9 , however patient became sedated. I cannot find the record to review this event but the xfer was postponed until today . The family states that she was being given buspar TID since admission and phenergan prn for nausea . She denies any anxiety or hx of etoh or drug abuse. Unclear why the buspar was being given but is the likely reason for her mental status change. Since holding the meds, family states she is back to baseline alertness and no further issues w/ mental status  She has hx of MI that was nearly fatal in 2004 and remains on ASA, BB, statin therapy . Her ASA was 81mg  prior to what seems like possible TIA earlier this year where she was changed to 162mg  of daily ASA. She suffers from ILD where she is treated by Dr Kendrick Fries w/ [facility] . Her Ofev was held recently b/c of her progressive illness . She has chronic pain treated w/ ultram 50mg  tid prn at home.   She states she has had general intolerance to PO since 8/3  admission. Did eat some yesterday. Has pain on R back that comes and goes. Making urine. Sleeps fine w/ melatonin qhs and is on PPI daily   Chart Review:  [facility] records available at bedside   Review of Systems:  Has nausea, emesis, change in mental status, R back pain.   Past Medical History:  Diagnosis Date  . Allergic rhinitis   . CAD (coronary artery disease)    HepaCoat stent to proximal RCA 2004 with acute MI  . Essential hypertension   . GERD (gastroesophageal reflux disease)   . History of acute inferior wall MI    Associated with RV infarct and cardiogenic shock 2004   . Hyperlipidemia   . Idiopathic pulmonary fibrosis (HCC)    Followed by Dr. Marchelle Gearing    Past Surgical History:  Procedure Laterality Date  . ABDOMINAL HYSTERECTOMY    . Arm surgery Left   . BLADDER SURGERY    . GALLBLADDER SURGERY      Social History:  reports that she quit smoking about 17 years ago. Her smoking use included Cigarettes. She has a 25.00 pack-year smoking history. She has never used smokeless tobacco. She reports that she does not drink alcohol or use drugs.  Allergies  Allergen Reactions  . Other     esbriet--severe GI side effects causing admission to hospital  . Codeine   . Penicillins   .  Sulfonamide Derivatives     Family History  Problem Relation Age of Onset  . Asthma Mother   . Stomach cancer Sister   . Colon cancer Son      Prior to Admission medications   Medication Sig Start Date End Date Taking? Authorizing Provider  aspirin 81 MG tablet Take 81 mg by mouth daily.    [provider]  desmopressin (DDAVP) 0.2 MG tablet Take 1 tablet by mouth at bedtime. 11/20/15   [provider]  megestrol (MEGACE) 40 MG/ML suspension take 12 milliliters by mouth once daily 11/27/16   Max Fickle B, MD  metoprolol succinate (TOPROL-XL) 50 MG 24 hr tablet Take 50 mg by mouth daily. Take with or immediately following a meal.    [provider]   mirtazapine (REMERON) 15 MG tablet Take 15 mg by mouth at bedtime.    [provider]  Multiple Vitamin (MULTIVITAMIN) tablet Take 1 tablet by mouth daily.    [provider]  MYRBETRIQ 50 MG TB24 tablet Take 1 tablet by mouth daily. 11/09/15   [provider]  OFEV 100 MG CAPS Take 1 tablet by mouth 2 (two) times daily.  10/27/15   [provider]  omeprazole (PRILOSEC) 20 MG capsule Take 20 mg by mouth daily.    [provider]  ondansetron (ZOFRAN) 4 MG tablet take 1 tablet by mouth every 6 hours if needed for nausea and vomiting 01/30/17   Waymon Budge, MD  promethazine (PHENERGAN) 12.5 MG tablet Take 1 tablet (12.5 mg total) by mouth every 6 (six) hours as needed for nausea or vomiting. 05/01/16   Lupita Leash, MD  simvastatin (ZOCOR) 40 MG tablet Take 40 mg by mouth daily.    [provider]  traMADol (ULTRAM) 50 MG tablet 3 (three) times daily. For cough by Dr. Garner Nash 02/15/15   [provider]   Physical Exam: There were no vitals filed for this visit.   General:  WF that actually looks quite good today. NAD   HEENT: mmm , anicteric   Cardiovascular: rrr, no mrg   Respiratory: diminished sounds throughout but no wheezing   Abdomen: soft, nt   Skin: warm , dry   Musculoskeletal: no focal deficits   Psychiatric: no anxiety , dpression visible today   Neurologic: no focal deficits   Wt Readings from Last 3 Encounters:  05/15/17 51.9 kg (114 lb 6.4 oz)  11/10/16 54.4 kg (120 lb)  08/04/16 51.3 kg (113 lb)    Labs on Admission:  Basic Metabolic Panel: No results for input(s): NA, K, CL, CO2, GLUCOSE, BUN, CREATININE, CALCIUM, MG, PHOS in the last 168 hours. Liver Function Tests: No results for input(s): AST, ALT, ALKPHOS, BILITOT, PROT, ALBUMIN in the last 168 hours. No results for input(s): LIPASE, AMYLASE in the last 168 hours. No results for input(s): AMMONIA in the last 168 hours. CBC: No  results for input(s): WBC, NEUTROABS, HGB, HCT, MCV, PLT in the last 168 hours. Cardiac Enzymes: No results for input(s): CKTOTAL, CKMB, CKMBINDEX, TROPONINI in the last 168 hours.  BNP (last 3 results) No results for input(s): BNP in the last 8760 hours.  ProBNP (last 3 results) No results for input(s): PROBNP in the last 8760 hours.  CBG: No results for input(s): GLUCAP in the last 168 hours.  Radiological Exams on Admission: No results found.      Active Problems:   Pyelonephritis   Assessment/Plan 1. Hydronephrosis - 2/2 8mm R sided  stone. Urology needs to eval patient for the 8mm stone .  NPO after midnight to allow any type of needed procedure . ASA 162mg  is only blood thinner . Checking INR tonight . Clears until MN  Then NPO . Attempting to contact urology at this time , however , likely they are already aware . Would touch base w/ them in AM again to ensure patient is seen given there was disruption in the xfer from yesterday .  2. UTI - e coli . Starting on zosyn while we await urology input . NS at 100cc/hr while poor PO intake and NPO after MN . cx pending at outside facility  3. Nephrolithiasis - per urology as stated above  4. CAD w/ MI in 2004 - ASA , statin , BB continued per home list  5. Insomnia - melatonin per home meds 6. Interstitial Lung Dz - O2 at 3L Joseph per home dose . Escalate as needed . Ofev on hold   7. Mental status change prior to transfer - I ensured no withdrawal risk and no anxiety . There is no reason at this time to use sedating meds . Avoiding at this time  8. ? TIA in 2018 - continuing at ASA 162mg  daily at this time   Code Status: full  Family Communication: husband at bedside w/ home med list  Disposition Plan/Anticipated LOS: home in 3 days   Time spent: 30  minutes  Alysia PennaScott Ameliana Brashear, MD  Internal Medicine Pager [phone] Cell [phone] If 7PM-7AM, please contact night-coverage at [email or web address], password [email or web address] 05/22/2017, 8:07  PM

## 2017-05-22 NOTE — Progress Notes (Signed)
[facility].Not in unit census tilll now On call MD paged

## 2017-05-22 NOTE — Progress Notes (Addendum)
Pharmacy Antibiotic Note  [name] is a 76 y.o. female admitted on 05/22/2017 with UTI.  Pharmacy has been consulted for cefepime dosing. Patient tranfers from outside hospital where she presented 8/3 for abd pain.  She had E. Coli growing in urine cx receiving ceftriaxone.  CT revealed 8mm R UPJ stone and hydronephrosis.  [facility].  Pharmacy asked to start zosyn but has PCN rxn with unknown rxn, will change to cefepime for similar gram negative coverage.    8/9 SCr at outside hospital = 0.67 mg/dl   Plan:  Cefepime 1gm IV q12h   [facility] - no susceptibilties  Once susceptibilities known, suggest de-escalation      No data recorded.  No results for input(s): WBC, CREATININE, LATICACIDVEN, VANCOTROUGH, VANCOPEAK, VANCORANDOM, GENTTROUGH, GENTPEAK, GENTRANDOM, TOBRATROUGH, TOBRAPEAK, TOBRARND, AMIKACINPEAK, AMIKACINTROU, AMIKACIN in the last 168 hours.  CrCl cannot be calculated (Patient's most recent lab result is older than the maximum 21 days allowed.).    Allergies  Allergen Reactions  . Other     esbriet--severe GI side effects causing admission to hospital  . Codeine   . Penicillins   . Sulfonamide Derivatives     Antimicrobials this admission: Ceftriaxone prior to admission 8/10 cefepime >>  Dose adjustments this admission:   Microbiology results: Ucx: E. Coli (at outside hospital)  Thank you for allowing pharmacy to be a part of this patient's care.  Juliette Alcideustin Zeigler, PharmD, BCPS.   Pager: [phone] 05/22/2017 9:21 PM

## 2017-05-23 ENCOUNTER — Encounter (HOSPITAL_COMMUNITY): Payer: Self-pay | Admitting: Certified Registered"

## 2017-05-23 ENCOUNTER — Inpatient Hospital Stay (HOSPITAL_COMMUNITY): Payer: Medicare HMO | Admitting: Anesthesiology

## 2017-05-23 ENCOUNTER — Inpatient Hospital Stay (HOSPITAL_COMMUNITY): Payer: Medicare HMO

## 2017-05-23 ENCOUNTER — Encounter (HOSPITAL_COMMUNITY)
Admission: AD | Disposition: A | Discharge status: Home / Self Care | Payer: Self-pay | Source: Other Acute Inpatient Hospital | Attending: Family Medicine

## 2017-05-23 DIAGNOSIS — N132 Hydronephrosis with renal and ureteral calculous obstruction: Secondary | ICD-10-CM

## 2017-05-23 DIAGNOSIS — N390 Urinary tract infection, site not specified: Secondary | ICD-10-CM

## 2017-05-23 DIAGNOSIS — N200 Calculus of kidney: Secondary | ICD-10-CM

## 2017-05-23 DIAGNOSIS — J84112 Idiopathic pulmonary fibrosis: Secondary | ICD-10-CM

## 2017-05-23 HISTORY — PX: CYSTOSCOPY W/ URETERAL STENT PLACEMENT: SHX1429

## 2017-05-23 LAB — BASIC METABOLIC PANEL
Anion gap: 7 (ref 5–15)
BUN: 8 mg/dL (ref 6–20)
CHLORIDE: 95 mmol/L — AB (ref 101–111)
CO2: 27 mmol/L (ref 22–32)
CREATININE: 0.7 mg/dL (ref 0.44–1.00)
Calcium: 8.6 mg/dL — ABNORMAL LOW (ref 8.9–10.3)
GFR calc Af Amer: >60 mL/min (ref >60)
GFR calc non Af Amer: >60 mL/min (ref >60)
GLUCOSE: 76 mg/dL (ref 65–99)
Potassium: 5 mmol/L (ref 3.5–5.1)
SODIUM: 129 mmol/L — AB (ref 135–145)

## 2017-05-23 LAB — CBC
HEMATOCRIT: 33.5 % — AB (ref 36.0–46.0)
Hemoglobin: 11.3 g/dL — ABNORMAL LOW (ref 12.0–15.0)
MCH: 29.7 pg (ref 26.0–34.0)
MCHC: 33.7 g/dL (ref 30.0–36.0)
MCV: 88.2 fL (ref 78.0–100.0)
PLATELETS: 277 10*3/uL (ref 150–400)
RBC: 3.8 MIL/uL — ABNORMAL LOW (ref 3.87–5.11)
RDW: 13.9 % (ref 11.5–15.5)
WBC: 17.4 10*3/uL — AB (ref 4.0–10.5)

## 2017-05-23 LAB — SURGICAL PCR SCREEN
MRSA, PCR: NEGATIVE
Staphylococcus aureus: NEGATIVE

## 2017-05-23 SURGERY — CYSTOSCOPY, WITH RETROGRADE PYELOGRAM AND URETERAL STENT INSERTION
Anesthesia: General | Laterality: Right

## 2017-05-23 MED ORDER — OXYCODONE HCL 5 MG/5ML PO SOLN: 5.0000 mg | Freq: Once | ORAL | Status: DC | PRN

## 2017-05-23 MED ORDER — LIDOCAINE 2% (20 MG/ML) 5 ML SYRINGE
INTRAMUSCULAR | Status: AC
  Filled 2017-05-23: qty 5

## 2017-05-23 MED ORDER — ONDANSETRON HCL 4 MG/2ML IJ SOLN
INTRAMUSCULAR | Status: DC | PRN
  Administered 2017-05-23: 4 mg via INTRAVENOUS

## 2017-05-23 MED ORDER — PHENYLEPHRINE 40 MCG/ML (10ML) SYRINGE FOR IV PUSH (FOR BLOOD PRESSURE SUPPORT)
PREFILLED_SYRINGE | INTRAVENOUS | Status: AC
  Filled 2017-05-23: qty 10

## 2017-05-23 MED ORDER — FENTANYL CITRATE (PF) 100 MCG/2ML IJ SOLN
25.0000 ug | INTRAMUSCULAR | Status: DC | PRN
  Administered 2017-05-23: 50 ug via INTRAVENOUS
  Administered 2017-05-23: 25 ug via INTRAVENOUS

## 2017-05-23 MED ORDER — PHENYLEPHRINE 40 MCG/ML (10ML) SYRINGE FOR IV PUSH (FOR BLOOD PRESSURE SUPPORT)
PREFILLED_SYRINGE | INTRAVENOUS | Status: DC | PRN
  Administered 2017-05-23: 80 ug via INTRAVENOUS

## 2017-05-23 MED ORDER — PROPOFOL 10 MG/ML IV BOLUS
INTRAVENOUS | Status: DC | PRN
  Administered 2017-05-23: 100 mg via INTRAVENOUS

## 2017-05-23 MED ORDER — HYDROMORPHONE HCL 1 MG/ML IJ SOLN: INTRAMUSCULAR | Status: DC | PRN

## 2017-05-23 MED ORDER — SODIUM CHLORIDE 0.9 % IR SOLN
Status: DC | PRN
  Administered 2017-05-23: 3000 mL via INTRAVESICAL

## 2017-05-23 MED ORDER — FENTANYL CITRATE (PF) 100 MCG/2ML IJ SOLN
INTRAMUSCULAR | Status: AC
  Filled 2017-05-23: qty 2

## 2017-05-23 MED ORDER — IOHEXOL 300 MG/ML  SOLN
INTRAMUSCULAR | Status: DC | PRN
  Administered 2017-05-23: 9 mL

## 2017-05-23 MED ORDER — ONDANSETRON HCL 4 MG/2ML IJ SOLN
INTRAMUSCULAR | Status: AC
  Filled 2017-05-23: qty 2

## 2017-05-23 MED ORDER — 0.9 % SODIUM CHLORIDE (POUR BTL) OPTIME
TOPICAL | Status: DC | PRN
  Administered 2017-05-23: 1000 mL

## 2017-05-23 MED ORDER — PROPOFOL 10 MG/ML IV BOLUS
INTRAVENOUS | Status: AC
  Filled 2017-05-23: qty 20

## 2017-05-23 MED ORDER — LIP MEDEX EX OINT
1.0000 "application " | TOPICAL_OINTMENT | CUTANEOUS | Status: DC | PRN
  Administered 2017-05-23: 1 via TOPICAL
  Filled 2017-05-23: qty 7

## 2017-05-23 MED ORDER — FENTANYL CITRATE (PF) 100 MCG/2ML IJ SOLN
INTRAMUSCULAR | Status: DC | PRN
  Administered 2017-05-23 (×2): 25 ug via INTRAVENOUS

## 2017-05-23 MED ORDER — OXYCODONE HCL 5 MG PO TABS: 5.0000 mg | ORAL_TABLET | Freq: Once | ORAL | Status: DC | PRN

## 2017-05-23 SURGICAL SUPPLY — 24 items
BAG URO CATCHER STRL LF (MISCELLANEOUS) ×3 IMPLANT
BASKET DAKOTA 1.9FR 11X120 (BASKET) IMPLANT
BASKET LASER NITINOL 1.9FR (BASKET) IMPLANT
CATH FOLEY LATEX FREE 20FR (CATHETERS)
CATH FOLEY LF 20FR (CATHETERS) IMPLANT
CATH INTERMIT  6FR 70CM (CATHETERS) ×3 IMPLANT
CLOTH BEACON ORANGE TIMEOUT ST (SAFETY) ×3 IMPLANT
COVER SURGICAL LIGHT HANDLE (MISCELLANEOUS) ×3 IMPLANT
EXTRACTOR STONE NITINOL NGAGE (UROLOGICAL SUPPLIES) IMPLANT
FIBER LASER TRAC TIP (UROLOGICAL SUPPLIES) IMPLANT
GLOVE BIO SURGEON STRL SZ8 (GLOVE) ×3 IMPLANT
GOWN STRL REUS W/TWL XL LVL3 (GOWN DISPOSABLE) ×3 IMPLANT
GUIDEWIRE ANG ZIPWIRE 038X150 (WIRE) ×3 IMPLANT
GUIDEWIRE STR DUAL SENSOR (WIRE) ×3 IMPLANT
MANIFOLD NEPTUNE II (INSTRUMENTS) ×3 IMPLANT
PACK CYSTO (CUSTOM PROCEDURE TRAY) ×3 IMPLANT
SHEATH ACCESS URETERAL 38CM (SHEATH) IMPLANT
STENT CONTOUR 6FRX26X.038 (STENTS) IMPLANT
STENT URET 6FRX26 CONTOUR (STENTS) ×3 IMPLANT
SYR CONTROL 10ML LL (SYRINGE) IMPLANT
SYRINGE IRR TOOMEY STRL 70CC (SYRINGE) IMPLANT
TUBE FEEDING 8FR 16IN STR KANG (MISCELLANEOUS) IMPLANT
TUBING CONNECTING 10 (TUBING) ×2 IMPLANT
TUBING CONNECTING 10' (TUBING) ×1

## 2017-05-23 NOTE — Anesthesia Postprocedure Evaluation (Signed)
Anesthesia Post Note  Patient: [name]  Procedure(s) Performed: Procedure(s) (LRB): CYSTOSCOPY WITH RETROGRADE PYELOGRAM/URETERAL STENT PLACEMENT (Right)     Patient location during evaluation: PACU Anesthesia Type: General Level of consciousness: awake and patient cooperative Pain management: pain level controlled Vital Signs Assessment: post-procedure vital signs reviewed and stable Respiratory status: spontaneous breathing, nonlabored ventilation, respiratory function stable and patient connected to nasal cannula oxygen Cardiovascular status: stable Postop Assessment: no signs of nausea or vomiting Anesthetic complications: no    Last Vitals:  Vitals:   05/23/17 1323 05/23/17 2228  BP: 133/69 (!) 110/59  Pulse: 82 95  Resp:  18  Temp: 36.8 C 36.8 C  SpO2: 100% 97%    Last Pain:  Vitals:   05/23/17 2228  TempSrc: Oral  PainSc:                  [name]

## 2017-05-23 NOTE — Anesthesia Preprocedure Evaluation (Signed)
Anesthesia Evaluation  Patient identified by MRN, date of birth, ID band Patient awake    Reviewed: Allergy & Precautions, NPO status , Patient's Chart, lab work & pertinent test results, reviewed documented beta blocker date and time   History of Anesthesia Complications Negative for: history of anesthetic complications  Airway Mallampati: II  TM Distance: >3 FB Neck ROM: Full    Dental  (+) Poor Dentition, Missing, Edentulous Upper   Pulmonary shortness of breath and Long-Term Oxygen Therapy, neg sleep apnea, neg recent URI, former smoker,  Pulmonary fibrosis followed by Dr Atlee AbideMaquaid, no inhalers, reports stable respiratory function with no increased sob or new/worsening cough, uses 3-4L o2 at all times   breath sounds clear to auscultation       Cardiovascular hypertension, Pt. on medications and Pt. 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, 2008 cath with stent, denies CP or recent changes by Cardiology   Neuro/Psych PSYCHIATRIC DISORDERS Depression negative neurological ROS     GI/Hepatic Neg liver ROS, GERD  Medicated and Controlled,  Endo/Other  negative endocrine ROS  Renal/GU Renal diseasestone     Musculoskeletal negative musculoskeletal ROS (+)   Abdominal   Peds  Hematology  (+) anemia ,   Anesthesia Other Findings Previous trach 2004 with well healed scar, multiple surgeries since removal  Reproductive/Obstetrics                             Anesthesia Physical Anesthesia Plan  ASA: III  Anesthesia Plan: General   Post-op Pain Management:    Induction: Intravenous  PONV Risk Score and Plan: 3 and Ondansetron and Dexamethasone  Airway Management Planned: LMA  Additional Equipment: None  Intra-op Plan:   Post-operative Plan: Extubation in OR  Informed Consent:  I have reviewed the patients History and Physical, chart, labs and discussed the procedure including the risks, benefits and alternatives for the proposed anesthesia with the patient or authorized representative who has indicated his/her understanding and acceptance.   Dental advisory given  Plan Discussed with: CRNA and Surgeon  Anesthesia Plan Comments:         Anesthesia Quick Evaluation

## 2017-05-23 NOTE — Op Note (Signed)
Operative Note  Date: 05/23/2017   Preoperative Diagnosis: Obstructing nephrolithiasis  Postoperative Diagnosis: same  Procedure(s) Performed:  1) Cystourethroscopy 2) Right ureteral stent placement 3) Right retrograde pyelogram 4) Intraoperative fluoroscopy with interpretation <1hr   Teaching Surgeon: Wilkie Aye, MD  Resident Surgeon:  Callie Fielding, MD  Anesthesia:  General via LMA  Fluids:  See anesthesia record   Estimated blood loss:  None  Cultures or specimens:  None  Complications:  None  Drains: Right 47Fr x 24 cm double J ureteral stent  -----  Indications: [name] is a 76 y.o. female transferred from OSH with around 1 week of treated UTI with ongoing poor tolerance to PO, nausea and vomiting with intermittent abdominal pain. CT reviewed, demonstrates 8mm right proximal UPJ stone with resulting right hydroureteronephrosis to the level of the stone. She presents today for right ureteral stent placement and cystourerthroscopy. Risks and benefits of the surgery were discussed, and patient agrees to proceed.   Findings:  Normal appearing bladder without masses or lesions. Right moderate hydronephrosis to level of UPJ where large filling defect noted, consistent with imaging. Successful placement of right JJ stent.   Description:  The patient was correctly identified in the preop holding area where written informed consent as well potential risk and complication was reviewed. She was brought back to the operative suite where a preinduction timeout was performed. Once correct information was verified, general anesthesia was induced via LMA. She was then gently placed into dorsal lithotomy position with SCDs in place for VTE prophylaxis. She was prepped and draped in the usual sterile fashion and given appropriate preoperative antibiotics with cefepime as an inpatient. A second timeout was then performed to verify patient name, side and site of the procedure.    Cystoscopy A  57F rigid cystoscope was inserted per the urethra with copious lubrication and normal saline irrigation running.  This demonstrated a normal urethra with no evidence of injury and a normal-appearing bladder with no evidence of trauma. Lateral ureteral orifice was identified with ease in the normal anatomic position.  There were no masses or lesion.   Retrograde Pyelogram:   We turned our attention to the right ureteral orifice. A sensor wire followed by a 6 F open ended catheter was used to intubate the UO.  Both were advanced to the level of the renal pelvis under fluoroscopic guidance and the sensor wire was removed. No hydronephrotic drip was noted. A  right-sided retrograde pyelogram was performed. Approximately 9cc of 50% isovue was injected under floroscopic observation.  The renal pelvis was appreciate. There were no filling defects or extravasation. The renal pelvis and calyces were intact. Moderate hydronephrosis was noted/   Stent placement  The sensor guide wire was passed into the 47F open ended catheter into the right ureteral orfice and advanced to the level of the renal pelvis.  The catheter was removed.  A   47Fr x 24cm JJ ureteral stent without string was passed over the guide wire to the level of the renal pelvis and assistance of a pusher over our wire under visual and fluoroscopic guidance.  When the stent was in the appropriate position, the wire was removed.  There was a good curl appreciated in the renal pelvis as well as the bladder.    The bladder was emptied.   Patient was awoken from general anesthesia having tolerated the procedure well with no complications. They were transferred to PACU in stable condition.   Post Op Plan:  1. Return to inpatient medicine service for post procedure monitoring.   Attestation:  Dr. Ronne BinningMcKenzie was present for the entire procedure.

## 2017-05-23 NOTE — Anesthesia Procedure Notes (Signed)
Procedure Name: LMA Insertion Date/Time: 05/23/2017 11:56 AM Performed by: Enriqueta ShutterWILLIFORD, Mckenley Birenbaum D Pre-anesthesia Checklist: Patient identified, Emergency Drugs available, Suction available and Patient being monitored Patient Re-evaluated:Patient Re-evaluated prior to induction Oxygen Delivery Method: Circle system utilized Preoxygenation: Pre-oxygenation with 100% oxygen Induction Type: IV induction Ventilation: Mask ventilation without difficulty LMA: LMA inserted LMA Size: 3.0 Tube type: Oral Number of attempts: 1 Placement Confirmation: ETT inserted through vocal cords under direct vision,  positive ETCO2 and breath sounds checked- equal and bilateral Tube secured with: Tape Dental Injury: Teeth and Oropharynx as per pre-operative assessment

## 2017-05-23 NOTE — Progress Notes (Signed)
PROGRESS NOTE  [name]  RUE:[id number] DOB: 05-29-1941 DOA: 05/22/2017 PCP: Richardean Chimera, MD at [facility]  Brief Narrative: [name] is a 76 y.o. female with a history of CAD s/p MI with stent 2004, 3l O2-dependent ILD, HTN, HLD, nephrolithiasis, and GERD who initially presented to [facility] on 8/3 with abdominal pain, nausea, vomiting, and weakness. WBC was 17.5 and urine culture grew fluoroquinolone-resistant E coli. Creatinine was wnl, IVF's started for dehydration. Ceftriaxone was started without clinical improvement. CT abd/pelvis showed 8mm stone at right UPJ with resulting hydroureteronephrosis to the level of the stone. She was subsequently transferred to [facility] for urology evaluation. Transfer was delayed on 8/9 due to transient diminished responsiveness thought to be due to multiple sedating medications which has been held with stable return to baseline mental status.   Assessment & Plan: Principal Problem:   Hydronephrosis Active Problems:   IPF (idiopathic pulmonary fibrosis) (HCC)   UTI (urinary tract infection)  Right nephrolithiasis with hydroureteronephrosis:  - Continue NPO for cystourethroscopy, stent placement and retrograde pyelogram per urology.  - No evidence of global renal impairment with normal creatinine. Will continue to monitor.  E. coli UTI complicated by nephrolithiasis:  - On cefepime (hx unknown PCN allergy), ok to transition to ceftriaxone per susceptibility testing at OSH (only cipro-resistant)  CAD w/ MI in 2004 s/p PCI: No chest pain - Continue home ASA, statin , beta blocker  Insomnia: Chronic - Ordered home medications: melatonin  Chronic hypoxic respiratory failure due to interstitial lung disease: At baseline currently. Followed by Dr. Kendrick Fries.  - Continue supplemental oxygen at 3L (home dose) - Ofev on hold   Transient AMS: Resolved, suspect due to sedating medications - Monitor and hold sedating medications as able.    TIA in 2018:  - Continuing at ASA 162mg , statin  DVT prophylaxis: SCDs Code Status: Full Family Communication: Husband at bedside Disposition Plan: Pending outcome of procedure today, possibly home in 24 hrs.   Consultants:   Urology  Procedures:   Cystourethrogram with right ureteral stent placement 8/11  Antimicrobials:  Cefepime 8/11   Subjective: Ate broth last night without nausea or vomiting. Abd/flank pain has resolved for a few days now. No fevers.   Objective: BP 115/83 (BP Location: Left Arm)   Pulse 84   Temp 97.8 F (36.6 C) (Oral)   Resp 18   Ht 5\' 3"  (1.6 m)   Wt 53.1 kg (117 lb 1 oz)   LMP  (LMP Unknown)   SpO2 98%   BMI 20.74 kg/m   General exam: 76 y.o. female in no distress Respiratory system: Non-labored breathing 3L. Diminished with diffuse velcro-like breath sounds.  Cardiovascular system: Regular rate and rhythm. No murmur, rub, or gallop. No JVD, and no pedal edema. Gastrointestinal system: Abdomen soft, non-tender, non-distended, with normoactive bowel sounds. No organomegaly or masses felt. Central nervous system: Alert and oriented. No focal neurological deficits. Extremities: Warm, no deformities Skin: Low transverse abdominal/pelvic surgical scar. Otherwise no rashes, lesions, ulcers Psychiatry: Judgement and insight appear normal. Mood & affect appropriate.   Data Reviewed: I have personally reviewed following labs and imaging studies  CBC:  Recent Labs Lab 05/22/17 2104 05/23/17 0550  WBC 21.3* 17.4*  HGB 11.6* 11.3*  HCT 34.0* 33.5*  MCV 88.8 88.2  PLT 266 277   Basic Metabolic Panel:  Recent Labs Lab 05/22/17 2104 05/23/17 0550  NA 128* 129*  K 4.4 5.0  CL 94* 95*  CO2 26 27  GLUCOSE 91 76  BUN 6 8  CREATININE 0.63 0.70  CALCIUM 8.7* 8.6*   GFR: Estimated Creatinine Clearance: 50.3 mL/min (by C-G formula based on SCr of 0.7 mg/dL). Liver Function Tests:  Recent Labs Lab 05/22/17 2104  AST 25  ALT 15    ALKPHOS 85  BILITOT 0.7  PROT 6.6  ALBUMIN 3.0*   No results for input(s): LIPASE, AMYLASE in the last 168 hours. No results for input(s): AMMONIA in the last 168 hours. Coagulation Profile:  Recent Labs Lab 05/22/17 2104  INR 1.14   Cardiac Enzymes: No results for input(s): CKTOTAL, CKMB, CKMBINDEX, TROPONINI in the last 168 hours. BNP (last 3 results) No results for input(s): PROBNP in the last 8760 hours. HbA1C: No results for input(s): HGBA1C in the last 72 hours. CBG: No results for input(s): GLUCAP in the last 168 hours. Lipid Profile: No results for input(s): CHOL, HDL, LDLCALC, TRIG, CHOLHDL, LDLDIRECT in the last 72 hours. Thyroid Function Tests: No results for input(s): TSH, T4TOTAL, FREET4, T3FREE, THYROIDAB in the last 72 hours. Anemia Panel: No results for input(s): VITAMINB12, FOLATE, FERRITIN, TIBC, IRON, RETICCTPCT in the last 72 hours. Urine analysis: No results found for: COLORURINE, APPEARANCEUR, LABSPEC, PHURINE, GLUCOSEU, HGBUR, BILIRUBINUR, KETONESUR, PROTEINUR, UROBILINOGEN, NITRITE, LEUKOCYTESUR No results found for this or any previous visit (from the past 240 hour(s)).    Radiology Studies: No results found.  Scheduled Meds: . aspirin EC  162 mg Oral Daily  . desmopressin  200 mcg Oral QHS  . docusate sodium  100 mg Oral BID  . feeding supplement  1 Container Oral TID BM  . metoprolol succinate  50 mg Oral Daily  . mirabegron ER  50 mg Oral Daily  . pantoprazole  40 mg Oral Daily  . simvastatin  40 mg Oral Daily   Continuous Infusions: . sodium chloride 100 mL/hr at 05/23/17 0825  . ceFEPime (MAXIPIME) IV 1 g (05/23/17 0931)     LOS: 1 day   Time spent: 25 minutes.  Hazeline Junkeryan Meshell Abdulaziz, MD [facility] Pager [phone]  If 7PM-7AM, please contact night-coverage [email or web address] Password [email or web address] 05/23/2017, 10:13 AM

## 2017-05-23 NOTE — Progress Notes (Signed)
Pt returned from OR condition stable. Assumed care of patient from off going RN. No changes in initial am assessment. Cont with plan of care

## 2017-05-23 NOTE — Progress Notes (Signed)
Patient husband called RN to bedside. Stated patient was talking on telephone and her words became mumbled. Per husband, when patient was trying to say "shopping" she said the wrong word. Husband also said pt asked for the "duck pan" instead of the bedpan. Neuro exam was completed with no deficits. Pt is alert & oriented x4, saying all words correctly. Pt was assisted up to BR with 1 assist and walker. No complaints other than moderate surgical pain. PRN Tramadol given. No neuro deficits seen at this time- will continue to monitor closely.

## 2017-05-23 NOTE — Consult Note (Signed)
Urology Consult Note    Requesting Attending Physician:  [name], [name], MD Service Requesting Consult: Internal Medicine  Service Providing Consult: Urology  Consulting Attending: Dr. Wilkie AyePatrick Xoe Hoe    Reason for Consult: nephrolithiasis   [name] is seen in consultation for reasons noted above at the request of [name], [name], MD on the internal medicine service.  This is a 76 y.o. yo patient with a history of CAD s/p MI with stent placement, ILD on chronic oxygen, HTN, HLD and GERD. She initially presented to [facility] on 8/3 with abdominal pain, nausea, vomiting, and weakness. WBC 17.5 on presentation. Cr wnl. Urine culture grew E coli, fluoroquinolone resistance. Was started on ceftriaxone, and over course of several days did not improve clinically. Ct abdomen pelvis was eventually obtained, which demonstrated an 8mm R proximal ureteral stone with resulting hydroureteronephrosis to the level of the stone.   Today she states that for the last 24 hours she has felt much better. She tolerated chicken broth last night. Has had intermittent daily bilateral flank pain/abdominal pain since initially being admitted, she states yesterday this resolved completely and she felt much better. Denies fevers chills at hospital. Endorses ongoing nausea. Denies vomiting last 24 hours. Had bowel movement yesterday.   [facility]  [facility]/3:  E coli: only resistant to ciprofloxacin, sensitive to ceftriaxone, ampicillin, nitro, cefepime, gentamicin  No blood cultures drawn.   History of nephrolithiasis, never had surgical intervention for one.    Past Medical History: Past Medical History:  Diagnosis Date  . Allergic rhinitis   . CAD (coronary artery disease)    HepaCoat stent to proximal RCA 2004 with acute MI  . Essential hypertension   . GERD (gastroesophageal reflux disease)   . History of acute inferior wall MI    Associated with RV infarct and  cardiogenic shock 2004   . Hyperlipidemia   . Idiopathic pulmonary fibrosis (HCC)    Followed by Dr. Marchelle Gearingamaswamy    Past Surgical History:  Past Surgical History:  Procedure Laterality Date  . ABDOMINAL HYSTERECTOMY    . Arm surgery Left   . BLADDER SURGERY    . GALLBLADDER SURGERY      Medication: Current Facility-Administered Medications  Medication Dose Route Frequency Provider Last Rate Last Dose  . 0.9 %  sodium chloride infusion   Intravenous Continuous [name], [name], MD 100 mL/hr at 05/22/17 2216    . aspirin EC tablet 162 mg  162 mg Oral Daily [name], [name], MD      . ceFEPIme (MAXIPIME) 1 g in dextrose 5 % 50 mL IVPB  1 g Intravenous Q12H [name], [name], RPH   Stopped at 05/22/17 2245  . desmopressin (DDAVP) tablet 200 mcg  200 mcg Oral QHS [name], [name], MD   200 mcg at 05/22/17 2225  . docusate sodium (COLACE) capsule 100 mg  100 mg Oral BID [name], [name], MD   100 mg at 05/22/17 2216  . feeding supplement (BOOST / RESOURCE BREEZE) liquid 1 Container  1 Container Oral TID BM [name], [name], MD      . lip balm (CARMEX) ointment 1 application  1 application Topical PRN [name], [name], MD   1 application at 05/23/17 [phone]  . metoprolol succinate (TOPROL-XL) 24 hr tablet 50 mg  50 mg Oral Daily [name], [name], MD   50 mg at 05/22/17 2217  . mirabegron ER (MYRBETRIQ) tablet 50 mg  50 mg Oral Daily [name], [name], MD  50 mg at 05/22/17 2216  . ondansetron (ZOFRAN) tablet 4 mg  4 mg Oral Q6H PRN [name], [name], MD       Or  . ondansetron (ZOFRAN) injection 4 mg  4 mg Intravenous Q6H PRN [name], [name], MD      . pantoprazole (PROTONIX) EC tablet 40 mg  40 mg Oral Daily [name], [name], MD      . polyethylene glycol (MIRALAX / GLYCOLAX) packet 17 g  17 g Oral Daily PRN [name], [name], MD      . simvastatin (ZOCOR) tablet 40 mg  40 mg Oral Daily [name], [name], MD      . traMADol (ULTRAM) tablet 50 mg  50 mg Oral Q6H PRN [name], [name], MD         Allergies: Allergies  Allergen Reactions  . Other     esbriet--severe GI side effects causing admission to hospital  . Codeine Other (See Comments)    Syncope   . Penicillins Other (See Comments)    Unknown reaction  Has patient had a PCN reaction causing immediate rash, facial/tongue/throat swelling, SOB or lightheadedness with hypotension: Unknown Has patient had a PCN reaction causing severe rash involving mucus membranes or skin necrosis: Unknown Has patient had a PCN reaction that required hospitalization: Unknown Has patient had a PCN reaction occurring within the last 10 years: Unknown If all of the above answers are "NO", then may proceed with Cephalosporin use.  . Sulfonamide Derivatives Rash    Social History: Social History  Substance Use Topics  . Smoking status: Former Smoker    Packs/day: 1.00    Years: 25.00    Types: Cigarettes    Quit date: 10/14/1999  . Smokeless tobacco: Never Used  . Alcohol use No    Family History Family History  Problem Relation Age of Onset  . Asthma Mother   . Stomach cancer Sister   . Colon cancer Son     Review of Systems 10 systems were reviewed and are negative except as noted specifically in the HPI.  Objective   Vital signs in last 24 hours: BP 115/83 (BP Location: Left Arm)   Pulse 84   Temp 97.8 F (36.6 C) (Oral)   Resp 18   Ht 5\' 3"  (1.6 m)   Wt 53.1 kg (117 lb 1 oz)   LMP  (LMP Unknown)   SpO2 98%   BMI 20.74 kg/m   Intake/Output last 3 shifts: I/O last 3 completed shifts: In: 523.3 [I.V.:473.3; IV Piggyback:50] Out: 250 [Urine:250]  Physical Exam General: NAD, A&O, resting, appropriate HEENT: [place]/AT, EOMI, MMM Pulmonary: Normal work of breathing on [place] Cardiovascular: HDS, adequate peripheral perfusion Abdomen: soft, NTTP, nondistended, no suprapubic fullness or tenderness. General mild tenderness to palpation.  GU: no bilateral CVA tenderness Extremities: warm and well perfused, no  edema Neuro: Appropriate, no focal neurological deficits  Most Recent Labs: Lab Results  Component Value Date   WBC 17.4 (H) 05/23/2017   HGB 11.3 (L) 05/23/2017   HCT 33.5 (L) 05/23/2017   PLT 277 05/23/2017    Lab Results  Component Value Date   NA 129 (L) 05/23/2017   K 5.0 05/23/2017   CL 95 (L) 05/23/2017   CO2 27 05/23/2017   BUN 8 05/23/2017   CREATININE 0.70 05/23/2017   CALCIUM 8.6 (L) 05/23/2017    Lab Results  Component Value Date   ALKPHOS 85 05/22/2017   BILITOT 0.7 05/22/2017   BILIDIR 0.1 12/04/2015   PROT 6.6 05/22/2017  ALBUMIN 3.0 (L) 05/22/2017   ALT 15 05/22/2017   AST 25 05/22/2017    Lab Results  Component Value Date   INR 1.14 05/22/2017     Urine Culture: None here    IMAGING: No results found.  ------  Assessment:  Patient is a 76 y.o. female transferred from OSH with around 1 week of treated UTI with ongoing poor tolerance to PO, nausea and vomiting with intermittent abdominal pain. CT reviewed, demonstrates 8mm right proximal UPJ stone with resulting right hydroureteronephrosis to the level of the stone.   Given recent UTI and ongoing leukocytosis, in addition to lack of patient's clinical improvement over last week of hospitalization, prefer to intervene with drainage of the right collecting system. Discussed this with patient this AM, who is in agreement to proceed. Discussed risks and benefits of right ureteral stent placement, including small but possible risk of worsening infection, ureteral or kidney injury, and inability to pass ureteral stent which would necessitate peructaneous neprhostomy tube placement.   Recommendations: 1. Continue NPO 2. Will take patient to operating room today for cystourethroscopy, right ureteral stent placement, right retrograde pyelogram 3. Continue cefepime, can narrowing following surgical intervention to ceftriaxone given OSH culture results.    Thank you for this consult. Please contact the  urology consult pager with any further questions/concerns.  Carol Fielding, MD Urology Surgical Resident   I have seen and evaluated the patient and I agree with Dr. Robinette Hansen assessment and plan.  Wilkie Aye  -----

## 2017-05-23 NOTE — Transfer of Care (Signed)
Immediate Anesthesia Transfer of Care Note  Patient: [name]  Procedure(s) Performed: Procedure(s): CYSTOSCOPY WITH RETROGRADE PYELOGRAM/URETERAL STENT PLACEMENT (Right)  Patient Location: PACU  Anesthesia Type:General  Level of Consciousness: awake, alert  and oriented  Airway & Oxygen Therapy: Patient Spontanous Breathing and Patient connected to nasal cannula oxygen  Post-op Assessment: Report given to RN and Post -op Vital signs reviewed and stable  Post vital signs: Reviewed and stable  Last Vitals:  Vitals:   05/23/17 1107 05/23/17 1230  BP: 115/73 (!) 97/59  Pulse: 90 91  Resp:    Temp:    SpO2: 98% (!) 79%    Last Pain:  Vitals:   05/23/17 1107  TempSrc:   PainSc: 0-No pain      Patients Stated Pain Goal: 3 (05/23/17 0825)  Complications: No apparent anesthesia complications

## 2017-05-24 ENCOUNTER — Encounter (HOSPITAL_COMMUNITY): Payer: Self-pay | Admitting: Urology

## 2017-05-24 LAB — BASIC METABOLIC PANEL
ANION GAP: 7 (ref 5–15)
BUN: 9 mg/dL (ref 6–20)
CHLORIDE: 100 mmol/L — AB (ref 101–111)
CO2: 28 mmol/L (ref 22–32)
Calcium: 8.6 mg/dL — ABNORMAL LOW (ref 8.9–10.3)
Creatinine, Ser: 0.67 mg/dL (ref 0.44–1.00)
GFR calc non Af Amer: >60 mL/min (ref >60)
GLUCOSE: 87 mg/dL (ref 65–99)
Potassium: 4.2 mmol/L (ref 3.5–5.1)
Sodium: 135 mmol/L (ref 135–145)

## 2017-05-24 LAB — CBC
HEMATOCRIT: 33.6 % — AB (ref 36.0–46.0)
HEMOGLOBIN: 11.3 g/dL — AB (ref 12.0–15.0)
MCH: 30.1 pg (ref 26.0–34.0)
MCHC: 33.6 g/dL (ref 30.0–36.0)
MCV: 89.4 fL (ref 78.0–100.0)
Platelets: 292 10*3/uL (ref 150–400)
RBC: 3.76 MIL/uL — ABNORMAL LOW (ref 3.87–5.11)
RDW: 13.8 % (ref 11.5–15.5)
WBC: 15.6 10*3/uL — AB (ref 4.0–10.5)

## 2017-05-24 MED ORDER — CEFPODOXIME PROXETIL 100 MG PO TABS: 100.0000 mg | ORAL_TABLET | Freq: Two times a day (BID) | ORAL | 0 refills | Status: DC

## 2017-05-24 NOTE — Progress Notes (Signed)
Nutrition Brief Note  Patient identified on the Malnutrition Screening Tool (MST) Report  Pt with insignificant weigh loss since January. Pt's weight weighed 113-114 lb in 2017. Pt eating 100% of meals at this time.  Wt Readings from Last 15 Encounters:  05/24/17 113 lb 1.5 oz (51.3 kg)  05/15/17 114 lb 6.4 oz (51.9 kg)  11/10/16 120 lb (54.4 kg)  08/04/16 113 lb (51.3 kg)  05/01/16 114 lb 6.4 oz (51.9 kg)  02/21/16 123 lb 6.4 oz (56 kg)  12/04/15 128 lb 6.4 oz (58.2 kg)  10/31/15 126 lb 12.8 oz (57.5 kg)  10/23/15 130 lb 6.4 oz (59.1 kg)  08/14/15 129 lb (58.5 kg)  06/01/15 130 lb 9.6 oz (59.2 kg)  05/04/15 133 lb 6.4 oz (60.5 kg)  03/02/15 132 lb 12.8 oz (60.2 kg)  10/25/14 136 lb (61.7 kg)  05/05/08 144 lb (65.3 kg)    Body mass index is 20.03 kg/m. Patient meets criteria for normal based on current BMI.   Current diet order is heart healthy, patient is consuming approximately 100% of meals at this time. Labs and medications reviewed.   No nutrition interventions warranted at this time. If nutrition issues arise, please consult RD.   Tilda FrancoLindsey Argyle Gustafson, MS, RD, LDN Pager: [phone] After Hours Pager: [phone]

## 2017-05-24 NOTE — Progress Notes (Signed)
Urology Progress Note   1 Day Post-Op   76 yo F transferred from OSH on 8/10 for UTI, nausea/vomiting and R obstructing stone. S/p right ureteral stent placement 8/11.   Subjective: Did well overnight, feeling better this AM. Afebrile, VSS. Made 2L urine. Took in PO without nausea or vomiting. States she continues to feel weak, unable to walk well without feeling fatigued. Endorsing some burning with urination/frequency.   Objective: Vital signs in last 24 hours: Temp:  [97.7 F (36.5 C)-98.6 F (37 C)] 98.6 F (37 C) (08/12 0415) Pulse Rate:  [75-98] 98 (08/12 0415) Resp:  [15-21] 16 (08/12 0415) BP: (97-133)/(59-76) 107/71 (08/12 0415) SpO2:  [97 %-100 %] 98 % (08/12 0415) Weight:  [51.3 kg (113 lb 1.5 oz)] 51.3 kg (113 lb 1.5 oz) (08/12 0415)  Intake/Output from previous day: 08/11 0701 - 08/12 0700 In: 2940 [P.O.:240; I.V.:2600; IV Piggyback:100] Out: 2050 [Urine:2050] Intake/Output this shift: No intake/output data recorded.  Physical Exam:  General: Alert and oriented CV: RRR Lungs: Clear Abdomen: Soft, nontender Ext: NT, No erythema  Lab Results:  Recent Labs  05/22/17 2104 05/23/17 0550 05/24/17 0459  HGB 11.6* 11.3* 11.3*  HCT 34.0* 33.5* 33.6*   BMET  Recent Labs  05/23/17 0550 05/24/17 0459  NA 129* 135  K 5.0 4.2  CL 95* 100*  CO2 27 28  GLUCOSE 76 87  BUN 8 9  CREATININE 0.70 0.67  CALCIUM 8.6* 8.6*     Studies/Results: Dg C-arm 1-60 Min-no Report  Result Date: 05/23/2017 Fluoroscopy was utilized by the requesting physician.  No radiographic interpretation.    Assessment/Plan:  76 y.o. female w/ E coli UTI, fluoroquinalone resistant, and obstructing right nephrolithiasis s/p right ureteral stent placement.   - transition to PO antibiotics (cefpodoxime or augmentin), to complete 14 day course - order PT to assist with mobilization - continue myrbetriq on discharge to help with stent symptoms  - allergic to flomax, will not  discharge with this medication - follow up with Dr. Ronne Bin[place]McKenzie in [place] 8/22 to discuss further stone management   Dispo: ok with discharge today from urologic perspective    LOS: 2 days   FILIPPOU, PAULINE L 05/24/2017, 10:20 AM

## 2017-05-24 NOTE — Progress Notes (Signed)
Ambulated in the hall with rolling walker and standby assist.... On O2 at 4l.....tolerated well. MD present and observed.

## 2017-05-24 NOTE — Progress Notes (Signed)
Pt d/c home with husband. D/c instruction given to husband. Cont with plan of care. No changes in intiatlal am assessment unchanged. Condition stable

## 2017-05-24 NOTE — Discharge Summary (Signed)
Physician Discharge Summary  [name] WNU:[id number] DOB: 03-07-1941 DOA: 05/22/2017  PCP: Carol Chimera, MD  Admit date: 05/22/2017 Discharge date: 05/24/2017  Admitted From: Home by way of [facility] Disposition: Home   Recommendations for Outpatient Follow-up:  1. Follow up with PCP in 1-2 weeks 2. Follow up with urology, Dr. Ronne Hansen 8/22. Will complete 14 days of antibiotics and start myrbetriq for right ureteral stent-related discomfort.  Home Health: None Equipment/Devices: None new (has rolling walker) Discharge Condition: Stable CODE STATUS: Full Diet recommendation: Regular  Brief/Interim Summary: [name] is a 76 y.o. female with a history of CAD s/p MI with stent 2004, 3l O2-dependent ILD, HTN, HLD, nephrolithiasis, and GERD who initially presented to [facility] on 8/3 with abdominal pain, nausea, vomiting, and weakness. WBC was 17.5 and urine culture grew fluoroquinolone-resistant E coli. Creatinine was wnl, IVF's started for dehydration. Ceftriaxone was started without clinical improvement. CT abd/pelvis showed 8mm stone at right UPJ with resulting hydroureteronephrosis to the level of the stone. She was subsequently transferred to [facility] for urology evaluation. Transfer was delayed on 8/9 due to transient diminished responsiveness thought to be due to multiple sedating medications which were held with stable return to baseline mental status. On 8/11 she received a right ureteral stent and has had an uncomplicated postoperative course.    Discharge Diagnoses:  Principal Problem:   Hydronephrosis Active Problems:   IPF (idiopathic pulmonary fibrosis) (HCC)   UTI (urinary tract infection)  Right nephrolithiasis with hydroureteronephrosis: s/p ureteral stent placement 8/11.  - Follow up with urology as below - Start myrbetriq for stent symptoms. Allergic to flomax.  - No evidence of global renal impairment with normal creatinine. Consider repeat  U/S.   E. coli UTI complicated by nephrolithiasis:  - On ceftriaxone (hx unknown PCN allergy) per susceptibility testing at OSH (only cipro-resistant). Will discharge to complete 14 days with cefpodoxime (8/4 > 8/17).  CAD w/ MI in 2004 s/p PCI: No chest pain - Continue home ASA, statin , beta blocker  Insomnia: Chronic - Continue home med  Chronic hypoxic respiratory failure due to interstitial lung disease: At baseline currently. Followed by Dr. Kendrick Hansen.  - Continue supplemental oxygen at 3L (home dose) - Ofev on hold   Transient AMS: Resolved, suspect due to sedating medications - Hold sedating medications as able.   TIA in 2018:  - Continuing at ASA 162mg , statin  Weakness: Mild, in setting of prolonged hospitalization. Still able to ambulate at baseline, has walked to nurse's station without difficulty.   Discharge Instructions Discharge Instructions    Diet - low sodium heart healthy    Complete by:  As directed    Discharge instructions    Complete by:  As directed    You may see some blood in the urine and may have some burning with urination for 48-72 hours. You also may notice that you have to urinate more frequently or urgently after your procedure which is normal.  You should call should you develop an inability urinate, fever > 101, persistent nausea and vomiting that prevents you from eating or drinking to stay hydrated.  If you have a stent, you will likely urinate more frequently and urgently until the stent is removed and you may experience some discomfort/pain in the lower abdomen and flank especially when urinating. You may take pain medication prescribed to you if needed for pain. You may also intermittently have blood in the urine until the stent is removed.  Discharge instructions    Complete by:  As directed    - Take cefpodoxime twice daily for 5 days to complete the course of antibiotics.  - Follow up with Dr. Ronne BinningMcKenzie as below   Increase activity  slowly    Complete by:  As directed      Allergies as of 05/24/2017      Reactions   Other    esbriet--severe GI side effects causing admission to hospital   Codeine Other (See Comments)   Syncope    Penicillins Other (See Comments)   Unknown reaction Has patient had a PCN reaction causing immediate rash, facial/tongue/throat swelling, SOB or lightheadedness with hypotension: Unknown Has patient had a PCN reaction causing severe rash involving mucus membranes or skin necrosis: Unknown Has patient had a PCN reaction that required hospitalization: Unknown Has patient had a PCN reaction occurring within the last 10 years: Unknown If all of the above answers are "NO", then may proceed with Cephalosporin use.   Sulfonamide Derivatives Rash      Medication List    STOP taking these medications   levofloxacin 500 MG tablet Commonly known as:  LEVAQUIN     TAKE these medications   aspirin 81 MG tablet Take 162 mg by mouth daily.   cefpodoxime 100 MG tablet Commonly known as:  VANTIN Take 1 tablet (100 mg total) by mouth 2 (two) times daily.   desmopressin 0.2 MG tablet Commonly known as:  DDAVP Take 1 tablet by mouth at bedtime.   megestrol 40 MG/ML suspension Commonly known as:  MEGACE take 12 milliliters by mouth once daily   metoprolol succinate 50 MG 24 hr tablet Commonly known as:  TOPROL-XL Take 50 mg by mouth every evening. Take with or immediately following a meal.   mirtazapine 15 MG tablet Commonly known as:  REMERON Take 15 mg by mouth at bedtime.   multivitamin tablet Take 1 tablet by mouth daily.   MYRBETRIQ 50 MG Tb24 tablet Generic drug:  mirabegron ER Take 1 tablet by mouth daily.   omeprazole 20 MG capsule Commonly known as:  PRILOSEC Take 20 mg by mouth daily.   ondansetron 4 MG tablet Commonly known as:  ZOFRAN take 1 tablet by mouth every 6 hours if needed for nausea and vomiting   promethazine 12.5 MG tablet Commonly known as:   PHENERGAN Take 1 tablet (12.5 mg total) by mouth every 6 (six) hours as needed for nausea or vomiting.   simvastatin 40 MG tablet Commonly known as:  ZOCOR Take 40 mg by mouth daily.   traMADol 50 MG tablet Commonly known as:  ULTRAM Take 50 mg by mouth 3 (three) times daily. For cough by Dr. Garner Nashaniels      Follow-up Information    McKenzie, Mardene CelestePatrick L, MD. Call.   Specialty:  Urology Why:  To make an appointment 8/22 for follow up  Contact information: [address] [place] [address] [phone]        Carol Chimeraaniel, Terry G, MD Follow up.   Specialty:  Family Medicine Contact information: [address] [place] [address] [phone]          Allergies  Allergen Reactions  . Other     esbriet--severe GI side effects causing admission to hospital  . Codeine Other (See Comments)    Syncope   . Penicillins Other (See Comments)    Unknown reaction  Has patient had a PCN reaction causing immediate rash, facial/tongue/throat swelling, SOB  or lightheadedness with hypotension: Unknown Has patient had a PCN reaction causing severe rash involving mucus membranes or skin necrosis: Unknown Has patient had a PCN reaction that required hospitalization: Unknown Has patient had a PCN reaction occurring within the last 10 years: Unknown If all of the above answers are "NO", then may proceed with Cephalosporin use.  . Sulfonamide Derivatives Rash    Consultations: Urology, Dr. Ronne Hansen  Procedures/Studies: Right ureteral stent placement 8/11  Subjective: No complaints. Eating well without N/V, no fever, pain or dysuria.   Discharge Exam: BP 101/63 (BP Location: Right Arm)   Pulse 97   Temp 98.1 F (36.7 C) (Oral)   Resp 17   Ht 5\' 3"  (1.6 m)   Wt 51.3 kg (113 lb 1.5 oz)   LMP  (LMP Unknown)   SpO2 99%   BMI 20.03 kg/m   General: Pt is alert, awake, not in acute distress Cardiovascular: RRR, S1/S2 +, no rubs, no gallops Respiratory: CTA bilaterally, no  wheezing, no rhonchi Abdominal: Soft, NT, ND, bowel sounds + Extremities: No edema, no cyanosis  Labs: Basic Metabolic Panel:  Recent Labs Lab 05/22/17 2104 05/23/17 0550 05/24/17 0459  NA 128* 129* 135  K 4.4 5.0 4.2  CL 94* 95* 100*  CO2 26 27 28   GLUCOSE 91 76 87  BUN 6 8 9   CREATININE 0.63 0.70 0.67  CALCIUM 8.7* 8.6* 8.6*   Liver Function Tests:  Recent Labs Lab 05/22/17 2104  AST 25  ALT 15  ALKPHOS 85  BILITOT 0.7  PROT 6.6  ALBUMIN 3.0*   CBC:  Recent Labs Lab 05/22/17 2104 05/23/17 0550 05/24/17 0459  WBC 21.3* 17.4* 15.6*  HGB 11.6* 11.3* 11.3*  HCT 34.0* 33.5* 33.6*  MCV 88.8 88.2 89.4  PLT 266 277 292    Time coordinating discharge: Approximately 40 minutes  Hazeline Junker, MD  [facility] 05/24/2017, 4:57 PM Pager [phone]

## 2017-05-25 ENCOUNTER — Encounter (HOSPITAL_COMMUNITY): Payer: Self-pay | Admitting: Urology

## 2017-06-03 ENCOUNTER — Ambulatory Visit (INDEPENDENT_AMBULATORY_CARE_PROVIDER_SITE_OTHER): Payer: Medicare HMO | Admitting: Urology

## 2017-06-03 ENCOUNTER — Other Ambulatory Visit (HOSPITAL_COMMUNITY)
Admission: RE | Admit: 2017-06-03 | Discharge: 2017-06-03 | Disposition: A | Discharge status: Home / Self Care | Payer: Medicare HMO | Source: Other Acute Inpatient Hospital | Attending: Urology | Admitting: Urology

## 2017-06-03 DIAGNOSIS — N200 Calculus of kidney: Secondary | ICD-10-CM | POA: Diagnosis not present

## 2017-06-04 ENCOUNTER — Other Ambulatory Visit: Payer: Self-pay | Admitting: Urology

## 2017-06-04 DIAGNOSIS — R05 Cough: Secondary | ICD-10-CM | POA: Diagnosis not present

## 2017-06-04 DIAGNOSIS — J8410 Pulmonary fibrosis, unspecified: Secondary | ICD-10-CM | POA: Diagnosis not present

## 2017-06-04 DIAGNOSIS — I10 Essential (primary) hypertension: Secondary | ICD-10-CM | POA: Diagnosis not present

## 2017-06-05 LAB — URINE CULTURE

## 2017-06-08 NOTE — Patient Instructions (Signed)
[name]  06/08/2017     @PREFPERIOPPHARMACY @   Your procedure is scheduled on  06/17/2017 .  Report to [facility] at  1130   A.M.  Call this number if you have problems the morning of surgery:  [phone]   Remember:  Do not eat food or drink liquids after midnight.  Take these medicines the morning of surgery with A SIP OF WATER  Metoprolol, myrbetriq, prilosec, zofran or phenergan, ultram.   Do not wear jewelry, make-up or nail polish.  Do not wear lotions, powders, or perfumes, or deoderant.  Do not shave 48 hours prior to surgery.  Men may shave face and neck.  Do not bring valuables to the hospital.  [facility] is not responsible for any belongings or valuables.  Contacts, dentures or bridgework may not be worn into surgery.  Leave your suitcase in the car.  After surgery it may be brought to your room.  For patients admitted to the hospital, discharge time will be determined by your treatment team.  Patients discharged the day of surgery will not be allowed to drive home.   Name and phone number of your driver:   family Special instructions:  None  Please read over the following fact sheets that you were given. Anesthesia Post-op Instructions and Care and Recovery After Surgery       Cystoscopy Cystoscopy is a procedure that is used to help diagnose and sometimes treat conditions that affect that lower urinary tract. The lower urinary tract includes the bladder and the tube that drains urine from the bladder out of the body (urethra). Cystoscopy is performed with a thin, tube-shaped instrument with a light and camera at the end (cystoscope). The cystoscope may be hard (rigid) or flexible, depending on the goal of the procedure.The cystoscope is inserted through the urethra, into the bladder. Cystoscopy may be recommended if you have:  Urinary tractinfections that keep coming back (recurring).  Blood in the urine (hematuria).  Loss of  bladder control (urinary incontinence) or an overactive bladder.  Unusual cells found in a urine sample.  A blockage in the urethra.  Painful urination.  An abnormality in the bladder found during an intravenous pyelogram (IVP) or CT scan.  Cystoscopy may also be done to remove a sample of tissue to be examined under a microscope (biopsy). Tell a health care provider about:  Any allergies you have.  All medicines you are taking, including vitamins, herbs, eye drops, creams, and over-the-counter medicines.  Any problems you or family members have had with anesthetic medicines.  Any blood disorders you have.  Any surgeries you have had.  Any medical conditions you have.  Whether you are pregnant or may be pregnant. What are the risks? Generally, this is a safe procedure. However, problems may occur, including:  Infection.  Bleeding.  Allergic reactions to medicines.  Damage to other structures or organs.  What happens before the procedure?  Ask your health care provider about: ? Changing or stopping your regular medicines. This is especially important if you are taking diabetes medicines or blood thinners. ? Taking medicines such as aspirin and ibuprofen. These medicines can thin your blood. Do not take these medicines before your procedure if your health care provider instructs you not to.  Follow instructions from your health care provider about eating or drinking restrictions.  You may be given antibiotic medicine to help prevent infection.  You may have  an exam or testing, such as X-rays of the bladder, urethra, or kidneys.  You may have urine tests to check for signs of infection.  Plan to have someone take you home after the procedure. What happens during the procedure?  To reduce your risk of infection,your health care team will wash or sanitize their hands.  You will be given one or more of the following: ? A medicine to help you relax (sedative). ? A  medicine to numb the area (local anesthetic).  The area around the opening of your urethra will be cleaned.  The cystoscope will be passed through your urethra into your bladder.  Germ-free (sterile)fluid will flow through the cystoscope to fill your bladder. The fluid will stretch your bladder so that your surgeon can clearly examine your bladder walls.  The cystoscope will be removed and your bladder will be emptied. The procedure may vary among health care providers and hospitals. What happens after the procedure?  You may have some soreness or pain in your abdomen and urethra. Medicines will be available to help you.  You may have some blood in your urine.  Do not drive for 24 hours if you received a sedative. This information is not intended to replace advice given to you by your health care provider. Make sure you discuss any questions you have with your health care provider. Document Released: 09/26/2000 Document Revised: 02/07/2016 Document Reviewed: 08/16/2015 Elsevier Interactive Patient Education  2017 [employer]  Cystoscopy, Care After Refer to this sheet in the next few weeks. These instructions provide you with information about caring for yourself after your procedure. Your health care provider may also give you more specific instructions. Your treatment has been planned according to current medical practices, but problems sometimes occur. Call your health care provider if you have any problems or questions after your procedure. What can I expect after the procedure? After the procedure, it is common to have:  Mild pain when you urinate. Pain should stop within a few minutes after you urinate. This may last for up to 1 week.  A small amount of blood in your urine for several days.  Feeling like you need to urinate but producing only a small amount of urine.  Follow these instructions at home:  Medicines  Take over-the-counter and prescription medicines only as  told by your health care provider.  If you were prescribed an antibiotic medicine, take it as told by your health care provider. Do not stop taking the antibiotic even if you start to feel better. General instructions   Return to your normal activities as told by your health care provider. Ask your health care provider what activities are safe for you.  Do not drive for 24 hours if you received a sedative.  Watch for any blood in your urine. If the amount of blood in your urine increases, call your health care provider.  Follow instructions from your health care provider about eating or drinking restrictions.  If a tissue sample was removed for testing (biopsy) during your procedure, it is your responsibility to get your test results. Ask your health care provider or the department performing the test when your results will be ready.  Drink enough fluid to keep your urine clear or pale yellow.  Keep all follow-up visits as told by your health care provider. This is important. Contact a health care provider if:  You have pain that gets worse or does not get better with medicine, especially pain when  you urinate.  You have difficulty urinating. Get help right away if:  You have more blood in your urine.  You have blood clots in your urine.  You have abdominal pain.  You have a fever or chills.  You are unable to urinate. This information is not intended to replace advice given to you by your health care provider. Make sure you discuss any questions you have with your health care provider. Document Released: 04/18/2005 Document Revised: 03/06/2016 Document Reviewed: 08/16/2015 Elsevier Interactive Patient Education  2017 [employer]  Monitored Anesthesia Care Anesthesia is a term that refers to techniques, procedures, and medicines that help a person stay safe and comfortable during a medical procedure. Monitored anesthesia care, or sedation, is one type of anesthesia. Your  anesthesia specialist may recommend sedation if you will be having a procedure that does not require you to be unconscious, such as:  Cataract surgery.  A dental procedure.  A biopsy.  A colonoscopy.  During the procedure, you may receive a medicine to help you relax (sedative). There are three levels of sedation:  Mild sedation. At this level, you may feel awake and relaxed. You will be able to follow directions.  Moderate sedation. At this level, you will be sleepy. You may not remember the procedure.  Deep sedation. At this level, you will be asleep. You will not remember the procedure.  The more medicine you are given, the deeper your level of sedation will be. Depending on how you respond to the procedure, the anesthesia specialist may change your level of sedation or the type of anesthesia to fit your needs. An anesthesia specialist will monitor you closely during the procedure. Let your health care provider know about:  Any allergies you have.  All medicines you are taking, including vitamins, herbs, eye drops, creams, and over-the-counter medicines.  Any use of steroids (by mouth or as a cream).  Any problems you or family members have had with sedatives and anesthetic medicines.  Any blood disorders you have.  Any surgeries you have had.  Any medical conditions you have, such as sleep apnea.  Whether you are pregnant or may be pregnant.  Any use of cigarettes, alcohol, or street drugs. What are the risks? Generally, this is a safe procedure. However, problems may occur, including:  Getting too much medicine (oversedation).  Nausea.  Allergic reaction to medicines.  Trouble breathing. If this happens, a breathing tube may be used to help with breathing. It will be removed when you are awake and breathing on your own.  Heart trouble.  Lung trouble.  Before the procedure Staying hydrated Follow instructions from your health care provider about hydration,  which may include:  Up to 2 hours before the procedure - you may continue to drink clear liquids, such as water, clear fruit juice, black coffee, and plain tea.  Eating and drinking restrictions Follow instructions from your health care provider about eating and drinking, which may include:  8 hours before the procedure - stop eating heavy meals or foods such as meat, fried foods, or fatty foods.  6 hours before the procedure - stop eating light meals or foods, such as toast or cereal.  6 hours before the procedure - stop drinking milk or drinks that contain milk.  2 hours before the procedure - stop drinking clear liquids.  Medicines Ask your health care provider about:  Changing or stopping your regular medicines. This is especially important if you are taking diabetes medicines or blood  thinners.  Taking medicines such as aspirin and ibuprofen. These medicines can thin your blood. Do not take these medicines before your procedure if your health care provider instructs you not to.  Tests and exams  You will have a physical exam.  You may have blood tests done to show: ? How well your kidneys and liver are working. ? How well your blood can clot.  General instructions  Plan to have someone take you home from the hospital or clinic.  If you will be going home right after the procedure, plan to have someone with you for 24 hours.  What happens during the procedure?  Your blood pressure, heart rate, breathing, level of pain and overall condition will be monitored.  An IV tube will be inserted into one of your veins.  Your anesthesia specialist will give you medicines as needed to keep you comfortable during the procedure. This may mean changing the level of sedation.  The procedure will be performed. After the procedure  Your blood pressure, heart rate, breathing rate, and blood oxygen level will be monitored until the medicines you were given have worn off.  Do not  drive for 24 hours if you received a sedative.  You may: ? Feel sleepy, clumsy, or nauseous. ? Feel forgetful about what happened after the procedure. ? Have a sore throat if you had a breathing tube during the procedure. ? Vomit. This information is not intended to replace advice given to you by your health care provider. Make sure you discuss any questions you have with your health care provider. Document Released: 06/25/2005 Document Revised: 03/07/2016 Document Reviewed: 01/20/2016 Elsevier Interactive Patient Education  2018 [employer] Monitored Anesthesia Care, Care After These instructions provide you with information about caring for yourself after your procedure. Your health care provider may also give you more specific instructions. Your treatment has been planned according to current medical practices, but problems sometimes occur. Call your health care provider if you have any problems or questions after your procedure. What can I expect after the procedure? After your procedure, it is common to:  Feel sleepy for several hours.  Feel clumsy and have poor balance for several hours.  Feel forgetful about what happened after the procedure.  Have poor judgment for several hours.  Feel nauseous or vomit.  Have a sore throat if you had a breathing tube during the procedure.  Follow these instructions at home: For at least 24 hours after the procedure:   Do not: ? Participate in activities in which you could fall or become injured. ? Drive. ? Use heavy machinery. ? Drink alcohol. ? Take sleeping pills or medicines that cause drowsiness. ? Make important decisions or sign legal documents. ? Take care of children on your own.  Rest. Eating and drinking  Follow the diet that is recommended by your health care provider.  If you vomit, drink water, juice, or soup when you can drink without vomiting.  Make sure you have little or no nausea before eating solid  foods. General instructions  Have a responsible adult stay with you until you are awake and alert.  Take over-the-counter and prescription medicines only as told by your health care provider.  If you smoke, do not smoke without supervision.  Keep all follow-up visits as told by your health care provider. This is important. Contact a health care provider if:  You keep feeling nauseous or you keep vomiting.  You feel light-headed.  You develop a rash.  You have a fever. Get help right away if:  You have trouble breathing. This information is not intended to replace advice given to you by your health care provider. Make sure you discuss any questions you have with your health care provider. Document Released: 01/20/2016 Document Revised: 05/21/2016 Document Reviewed: 01/20/2016 Elsevier Interactive Patient Education  [employer].

## 2017-06-11 ENCOUNTER — Other Ambulatory Visit: Payer: Self-pay

## 2017-06-11 ENCOUNTER — Encounter (HOSPITAL_COMMUNITY): Payer: Self-pay

## 2017-06-11 ENCOUNTER — Encounter (HOSPITAL_COMMUNITY)
Admission: RE | Admit: 2017-06-11 | Discharge: 2017-06-11 | Disposition: A | Discharge status: Home / Self Care | Payer: Medicare HMO | Source: Ambulatory Visit | Attending: Urology | Admitting: Urology

## 2017-06-11 DIAGNOSIS — E785 Hyperlipidemia, unspecified: Secondary | ICD-10-CM | POA: Diagnosis not present

## 2017-06-11 DIAGNOSIS — I10 Essential (primary) hypertension: Secondary | ICD-10-CM | POA: Insufficient documentation

## 2017-06-11 DIAGNOSIS — J9611 Chronic respiratory failure with hypoxia: Secondary | ICD-10-CM | POA: Insufficient documentation

## 2017-06-11 DIAGNOSIS — Z01812 Encounter for preprocedural laboratory examination: Secondary | ICD-10-CM | POA: Diagnosis not present

## 2017-06-11 DIAGNOSIS — J84112 Idiopathic pulmonary fibrosis: Secondary | ICD-10-CM | POA: Insufficient documentation

## 2017-06-11 DIAGNOSIS — F329 Major depressive disorder, single episode, unspecified: Secondary | ICD-10-CM | POA: Insufficient documentation

## 2017-06-11 DIAGNOSIS — K219 Gastro-esophageal reflux disease without esophagitis: Secondary | ICD-10-CM | POA: Insufficient documentation

## 2017-06-11 DIAGNOSIS — I2510 Atherosclerotic heart disease of native coronary artery without angina pectoris: Secondary | ICD-10-CM | POA: Insufficient documentation

## 2017-06-11 HISTORY — DX: Other complications of anesthesia, initial encounter: T88.59XA

## 2017-06-11 HISTORY — DX: Adverse effect of unspecified anesthetic, initial encounter: T41.45XA

## 2017-06-11 HISTORY — DX: Nausea with vomiting, unspecified: Z98.890

## 2017-06-11 HISTORY — DX: Other specified postprocedural states: R11.2

## 2017-06-11 NOTE — Progress Notes (Signed)
Dr. Diona BrownerMcDowell consulted to look @ critical EKG by Dr. Tomasa BlaseSchultz. Interpreted as Sinus Tach instead of SVT which was machine interpretation.

## 2017-06-17 ENCOUNTER — Encounter (HOSPITAL_COMMUNITY)
Admission: RE | Disposition: A | Discharge status: Home / Self Care | Payer: Self-pay | Source: Ambulatory Visit | Attending: Urology

## 2017-06-17 ENCOUNTER — Ambulatory Visit (HOSPITAL_COMMUNITY): Payer: Medicare HMO | Admitting: Anesthesiology

## 2017-06-17 ENCOUNTER — Ambulatory Visit (HOSPITAL_COMMUNITY)
Admission: RE | Admit: 2017-06-17 | Discharge: 2017-06-17 | Disposition: A | Discharge status: Home / Self Care | Payer: Medicare HMO | Source: Ambulatory Visit | Attending: Urology | Admitting: Urology

## 2017-06-17 ENCOUNTER — Encounter (HOSPITAL_COMMUNITY): Payer: Self-pay | Admitting: *Deleted

## 2017-06-17 ENCOUNTER — Ambulatory Visit (HOSPITAL_COMMUNITY): Payer: Medicare HMO

## 2017-06-17 DIAGNOSIS — I10 Essential (primary) hypertension: Secondary | ICD-10-CM | POA: Diagnosis not present

## 2017-06-17 DIAGNOSIS — Z87891 Personal history of nicotine dependence: Secondary | ICD-10-CM | POA: Insufficient documentation

## 2017-06-17 DIAGNOSIS — I2510 Atherosclerotic heart disease of native coronary artery without angina pectoris: Secondary | ICD-10-CM | POA: Diagnosis not present

## 2017-06-17 DIAGNOSIS — Z79818 Long term (current) use of other agents affecting estrogen receptors and estrogen levels: Secondary | ICD-10-CM | POA: Insufficient documentation

## 2017-06-17 DIAGNOSIS — N209 Urinary calculus, unspecified: Secondary | ICD-10-CM

## 2017-06-17 DIAGNOSIS — Z79899 Other long term (current) drug therapy: Secondary | ICD-10-CM | POA: Diagnosis not present

## 2017-06-17 DIAGNOSIS — Z7982 Long term (current) use of aspirin: Secondary | ICD-10-CM | POA: Insufficient documentation

## 2017-06-17 DIAGNOSIS — N200 Calculus of kidney: Secondary | ICD-10-CM | POA: Diagnosis not present

## 2017-06-17 DIAGNOSIS — J84112 Idiopathic pulmonary fibrosis: Secondary | ICD-10-CM | POA: Insufficient documentation

## 2017-06-17 DIAGNOSIS — I252 Old myocardial infarction: Secondary | ICD-10-CM | POA: Diagnosis not present

## 2017-06-17 DIAGNOSIS — K219 Gastro-esophageal reflux disease without esophagitis: Secondary | ICD-10-CM | POA: Insufficient documentation

## 2017-06-17 DIAGNOSIS — N201 Calculus of ureter: Secondary | ICD-10-CM | POA: Diagnosis not present

## 2017-06-17 DIAGNOSIS — Z955 Presence of coronary angioplasty implant and graft: Secondary | ICD-10-CM | POA: Insufficient documentation

## 2017-06-17 DIAGNOSIS — Z9981 Dependence on supplemental oxygen: Secondary | ICD-10-CM | POA: Diagnosis not present

## 2017-06-17 DIAGNOSIS — E785 Hyperlipidemia, unspecified: Secondary | ICD-10-CM | POA: Diagnosis not present

## 2017-06-17 HISTORY — PX: HOLMIUM LASER APPLICATION: SHX5852

## 2017-06-17 HISTORY — PX: CYSTOSCOPY/RETROGRADE/URETEROSCOPY: SHX5316

## 2017-06-17 SURGERY — CYSTOSCOPY/RETROGRADE/URETEROSCOPY
Anesthesia: General | Site: Ureter | Laterality: Right

## 2017-06-17 MED ORDER — PROPOFOL 10 MG/ML IV BOLUS
INTRAVENOUS | Status: DC | PRN
  Administered 2017-06-17: 75 mg via INTRAVENOUS
  Administered 2017-06-17: 25 mg via INTRAVENOUS

## 2017-06-17 MED ORDER — DEXAMETHASONE SODIUM PHOSPHATE 4 MG/ML IJ SOLN
4.0000 mg | Freq: Once | INTRAMUSCULAR | Status: AC
  Administered 2017-06-17: 4 mg via INTRAVENOUS
  Filled 2017-06-17: qty 1

## 2017-06-17 MED ORDER — FENTANYL CITRATE (PF) 100 MCG/2ML IJ SOLN
25.0000 ug | INTRAMUSCULAR | Status: DC | PRN
  Administered 2017-06-17: 50 ug via INTRAVENOUS
  Filled 2017-06-17: qty 2

## 2017-06-17 MED ORDER — FENTANYL CITRATE (PF) 100 MCG/2ML IJ SOLN
INTRAMUSCULAR | Status: DC | PRN
Start: 2017-06-17 — End: 2017-06-17
  Administered 2017-06-17: 25 ug via INTRAVENOUS
  Administered 2017-06-17: 50 ug via INTRAVENOUS

## 2017-06-17 MED ORDER — STERILE WATER FOR IRRIGATION IR SOLN
Status: DC | PRN
  Administered 2017-06-17: 500 mL

## 2017-06-17 MED ORDER — SODIUM CHLORIDE 0.9 % IR SOLN
Status: DC | PRN
  Administered 2017-06-17 (×2): 3000 mL via INTRAVESICAL

## 2017-06-17 MED ORDER — DIATRIZOATE MEGLUMINE 30 % UR SOLN
URETHRAL | Status: DC | PRN
  Administered 2017-06-17: 10 mL via URETHRAL

## 2017-06-17 MED ORDER — HYDROCODONE-ACETAMINOPHEN 5-325 MG PO TABS: 1.0000 | ORAL_TABLET | Freq: Four times a day (QID) | ORAL | 0 refills | Status: AC | PRN

## 2017-06-17 MED ORDER — MIDAZOLAM HCL 2 MG/2ML IJ SOLN
1.0000 mg | INTRAMUSCULAR | Status: AC
  Administered 2017-06-17: 1 mg via INTRAVENOUS
  Filled 2017-06-17: qty 2

## 2017-06-17 MED ORDER — ONDANSETRON HCL 4 MG/2ML IJ SOLN
4.0000 mg | Freq: Once | INTRAMUSCULAR | Status: AC
  Administered 2017-06-17: 4 mg via INTRAVENOUS
  Filled 2017-06-17: qty 2

## 2017-06-17 MED ORDER — DIATRIZOATE MEGLUMINE 30 % UR SOLN
URETHRAL | Status: AC
  Filled 2017-06-17: qty 300

## 2017-06-17 MED ORDER — LACTATED RINGERS IV SOLN
INTRAVENOUS | Status: DC
  Administered 2017-06-17: 12:00:00 via INTRAVENOUS

## 2017-06-17 MED ORDER — PHENYLEPHRINE HCL 10 MG/ML IJ SOLN
INTRAMUSCULAR | Status: DC | PRN
  Administered 2017-06-17 (×3): 80 ug via INTRAVENOUS

## 2017-06-17 MED ORDER — CEFPODOXIME PROXETIL 100 MG PO TABS: 100.0000 mg | ORAL_TABLET | Freq: Two times a day (BID) | ORAL | 0 refills | Status: AC

## 2017-06-17 MED ORDER — LIDOCAINE HCL (CARDIAC) 20 MG/ML IV SOLN
INTRAVENOUS | Status: DC | PRN
  Administered 2017-06-17: 40 mg via INTRAVENOUS

## 2017-06-17 MED ORDER — CEFTRIAXONE SODIUM 1 G IJ SOLR
1.0000 g | INTRAMUSCULAR | Status: AC
  Administered 2017-06-17: 1 g via INTRAVENOUS
  Filled 2017-06-17: qty 10

## 2017-06-17 SURGICAL SUPPLY — 24 items
BAG DRAIN URO TABLE W/ADPT NS (DRAPE) ×3 IMPLANT
BAG HAMPER (MISCELLANEOUS) ×3 IMPLANT
CATH INTERMIT  6FR 70CM (CATHETERS) ×3 IMPLANT
CLOTH BEACON ORANGE TIMEOUT ST (SAFETY) ×3 IMPLANT
EXTRACTOR STONE NITINOL NGAGE (UROLOGICAL SUPPLIES) ×3 IMPLANT
FIBER LASER FLEXIVA 200 (UROLOGICAL SUPPLIES) ×3 IMPLANT
GLOVE BIO SURGEON STRL SZ8 (GLOVE) ×3 IMPLANT
GLOVE BIOGEL PI IND STRL 7.0 (GLOVE) ×1 IMPLANT
GLOVE BIOGEL PI INDICATOR 7.0 (GLOVE) ×2
GLOVE ECLIPSE 6.5 STRL STRAW (GLOVE) ×3 IMPLANT
GLOVE EXAM NITRILE MD LF STRL (GLOVE) ×3 IMPLANT
GOWN STRL REUS W/ TWL LRG LVL3 (GOWN DISPOSABLE) ×1 IMPLANT
GOWN STRL REUS W/ TWL XL LVL3 (GOWN DISPOSABLE) ×1 IMPLANT
GOWN STRL REUS W/TWL LRG LVL3 (GOWN DISPOSABLE) ×2
GOWN STRL REUS W/TWL XL LVL3 (GOWN DISPOSABLE) ×2
GUIDEWIRE STR DUAL SENSOR (WIRE) ×3 IMPLANT
GUIDEWIRE STR ZIPWIRE 035X150 (MISCELLANEOUS) ×3 IMPLANT
IV NS IRRIG 3000ML ARTHROMATIC (IV SOLUTION) ×6 IMPLANT
KIT ROOM TURNOVER AP CYSTO (KITS) ×3 IMPLANT
MANIFOLD NEPTUNE II (INSTRUMENTS) ×3 IMPLANT
PACK CYSTO (CUSTOM PROCEDURE TRAY) ×3 IMPLANT
PAD ARMBOARD 7.5X6 YLW CONV (MISCELLANEOUS) ×3 IMPLANT
STENT URET 6FRX26 CONTOUR (STENTS) ×3 IMPLANT
WATER STERILE IRR 500ML POUR (IV SOLUTION) ×3 IMPLANT

## 2017-06-17 NOTE — Progress Notes (Signed)
Dentures returned to patient. 

## 2017-06-17 NOTE — Anesthesia Postprocedure Evaluation (Signed)
Anesthesia Post Note Late Entry for 1431  Patient: [name]  Procedure(s) Performed: Procedure(s) (LRB): CYSTOSCOPY/RETROGRADE/URETEROSCOPY/ RIGHT STENT EXCHANGE (Right) HOLMIUM LASER APPLICATION (Right)  Patient location during evaluation: PACU Anesthesia Type: General Level of consciousness: awake, oriented and patient cooperative Pain management: pain level controlled Vital Signs Assessment: post-procedure vital signs reviewed and stable Respiratory status: spontaneous breathing and patient connected to face mask oxygen Cardiovascular status: stable Postop Assessment: no signs of nausea or vomiting Anesthetic complications: no     Last Vitals:  Vitals:   06/17/17 1431 06/17/17 1456  BP: 121/67 132/86  Pulse: (!) 102 (!) 104  Resp: 20 20  Temp:  36.9 C  SpO2: 96% 92%    Last Pain:  Vitals:   06/17/17 1456  TempSrc: Oral  PainSc:                  [name], AMY A

## 2017-06-17 NOTE — Anesthesia Preprocedure Evaluation (Signed)
Anesthesia Evaluation  Patient identified by MRN, date of birth, ID band Patient awake    Reviewed: Allergy & Precautions, NPO status , Patient's Chart, lab work & pertinent test results, reviewed documented beta blocker date and time   History of Anesthesia Complications (+) PONVNegative for: history of anesthetic complications  Airway Mallampati: II  TM Distance: >3 FB Neck ROM: Full    Dental  (+) Poor Dentition, Missing, Edentulous Upper   Pulmonary shortness of breath and Long-Term Oxygen Therapy, neg sleep apnea, neg recent URI, former smoker,  Pulmonary fibrosis followed by Dr Atlee AbideMaquaid, no inhalers, reports stable respiratory function with no increased sob or new/worsening cough, uses 3-4L o2 at all times   breath sounds clear to auscultation       Cardiovascular hypertension, Pt. on medications and Pt. 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, 2008 cath with stent, denies CP or recent changes by Cardiology   Neuro/Psych PSYCHIATRIC DISORDERS Depression negative neurological ROS     GI/Hepatic Neg liver ROS, GERD  Medicated and Controlled,  Endo/Other  negative endocrine ROS  Renal/GU Renal diseasestone     Musculoskeletal negative musculoskeletal ROS (+)   Abdominal   Peds  Hematology  (+) anemia ,   Anesthesia Other Findings Previous trach 2004 with well healed scar, multiple surgeries since removal  Reproductive/Obstetrics                             Anesthesia Physical Anesthesia Plan  ASA: III  Anesthesia Plan: General   Post-op Pain Management:    Induction: Intravenous  PONV Risk Score and Plan: 3 and Ondansetron and Dexamethasone  Airway Management Planned: LMA  Additional Equipment:   Intra-op Plan:   Post-operative Plan: Extubation in OR  Informed  Consent: I have reviewed the patients History and Physical, chart, labs and discussed the procedure including the risks, benefits and alternatives for the proposed anesthesia with the patient or authorized representative who has indicated his/her understanding and acceptance.   Dental advisory given  Plan Discussed with: CRNA and Surgeon  Anesthesia Plan Comments:         Anesthesia Quick Evaluation

## 2017-06-17 NOTE — Anesthesia Procedure Notes (Signed)
Procedure Name: LMA Insertion Date/Time: 06/17/2017 1:20 PM Performed by: Pernell DupreADAMS, Carol Callaway A Pre-anesthesia Checklist: Patient identified, Timeout performed, Emergency Drugs available, Suction available and Patient being monitored Patient Re-evaluated:Patient Re-evaluated prior to induction Oxygen Delivery Method: Circle system utilized Preoxygenation: Pre-oxygenation with 100% oxygen Induction Type: IV induction Ventilation: Mask ventilation without difficulty LMA: LMA inserted LMA Size: 3.0 Number of attempts: 1 Placement Confirmation: positive ETCO2 and breath sounds checked- equal and bilateral Tube secured with: Tape Dental Injury: Teeth and Oropharynx as per pre-operative assessment

## 2017-06-17 NOTE — Discharge Instructions (Signed)

## 2017-06-17 NOTE — Transfer of Care (Signed)
Immediate Anesthesia Transfer of Care Note  Patient: [name]  Procedure(s) Performed: Procedure(s): CYSTOSCOPY/RETROGRADE/URETEROSCOPY/ RIGHT STENT EXCHANGE (Right) HOLMIUM LASER APPLICATION (Right)  Patient Location: PACU  Anesthesia Type:General  Level of Consciousness: awake, oriented and patient cooperative  Airway & Oxygen Therapy: Patient Spontanous Breathing and Patient connected to nasal cannula oxygen  Post-op Assessment: Report given to RN and Post -op Vital signs reviewed and stable  Post vital signs: Reviewed and stable  Last Vitals:  Vitals:   06/17/17 1118  BP: 125/86  Pulse: (!) 115  Resp: (!) 25  Temp: 36.4 C  SpO2: 95%    Last Pain:  Vitals:   06/17/17 1118  TempSrc: Oral      Patients Stated Pain Goal: 2 (06/17/17 1117)  Complications: No apparent anesthesia complications

## 2017-06-17 NOTE — H&P (View-Only) (Signed)
Urology Consult Note    Requesting Attending Physician:  [name], Carol B, MD Service Requesting Consult: Internal Medicine  Service Providing Consult: Urology  Consulting Attending: Dr. Wilkie AyePatrick Ameliah Hansen    Reason for Consult: nephrolithiasis   Carol Lelon FrohlichM [name] is seen in consultation for reasons noted above at the request of [name], Carol B, MD on the internal medicine service.  This is a 76 y.o. yo patient with a history of CAD s/p MI with stent placement, ILD on chronic oxygen, HTN, HLD and GERD. She initially presented to [facility] on 8/3 with abdominal pain, nausea, vomiting, and weakness. WBC 17.5 on presentation. Cr wnl. Urine culture grew E coli, fluoroquinolone resistance. Was started on ceftriaxone, and over course of several days did not improve clinically. Ct abdomen pelvis was eventually obtained, which demonstrated an 8mm R proximal ureteral stone with resulting hydroureteronephrosis to the level of the stone.   Today she states that for the last 24 hours she has felt much better. She tolerated chicken broth last night. Has had intermittent daily bilateral flank pain/abdominal pain since initially being admitted, she states yesterday this resolved completely and she felt much better. Denies fevers chills at hospital. Endorses ongoing nausea. Denies vomiting last 24 hours. Had bowel movement yesterday.   [facility]  [facility]:  E coli: only resistant to ciprofloxacin, sensitive to ceftriaxone, ampicillin, nitro, cefepime, gentamicin  No blood cultures drawn.   History of nephrolithiasis, never had surgical intervention for one.    Past Medical History: Past Medical History:  Diagnosis Date  . Allergic rhinitis   . CAD (coronary artery disease)    HepaCoat stent to proximal RCA 2004 with acute MI  . Essential hypertension   . GERD (gastroesophageal reflux disease)   . History of acute inferior wall MI    Associated with RV infarct and  cardiogenic shock 2004   . Hyperlipidemia   . Idiopathic pulmonary fibrosis (HCC)    Followed by Dr. Marchelle Hansen    Past Surgical History:  Past Surgical History:  Procedure Laterality Date  . ABDOMINAL HYSTERECTOMY    . Arm surgery Left   . BLADDER SURGERY    . GALLBLADDER SURGERY      Medication: Current Facility-Administered Medications  Medication Dose Route Frequency Provider Last Rate Last Dose  . 0.9 %  sodium chloride infusion   Intravenous Continuous [name], [name], MD 100 mL/hr at 05/22/17 2216    . aspirin EC tablet 162 mg  162 mg Oral Daily [name], [name], MD      . ceFEPIme (MAXIPIME) 1 Hansen in dextrose 5 % 50 mL IVPB  1 Hansen Intravenous Q12H [name], [name], RPH   Stopped at 05/22/17 2245  . desmopressin (DDAVP) tablet 200 mcg  200 mcg Oral QHS [name], [name], MD   200 mcg at 05/22/17 2225  . docusate sodium (COLACE) capsule 100 mg  100 mg Oral BID [name], [name], MD   100 mg at 05/22/17 2216  . feeding supplement (BOOST / RESOURCE BREEZE) liquid 1 Container  1 Container Oral TID BM [name], [name], MD      . lip balm (CARMEX) ointment 1 application  1 application Topical PRN [name], Carol B, MD   1 application at 05/23/17 [phone]  . metoprolol succinate (TOPROL-XL) 24 hr tablet 50 mg  50 mg Oral Daily [name], [name], MD   50 mg at 05/22/17 2217  . mirabegron ER (MYRBETRIQ) tablet 50 mg  50 mg Oral Daily [name], [name], MD  50 mg at 05/22/17 2216  . ondansetron (ZOFRAN) tablet 4 mg  4 mg Oral Q6H PRN [name], [name], MD       Or  . ondansetron (ZOFRAN) injection 4 mg  4 mg Intravenous Q6H PRN [name], [name], MD      . pantoprazole (PROTONIX) EC tablet 40 mg  40 mg Oral Daily [name], [name], MD      . polyethylene glycol (MIRALAX / GLYCOLAX) packet 17 Hansen  17 Hansen Oral Daily PRN [name], [name], MD      . simvastatin (ZOCOR) tablet 40 mg  40 mg Oral Daily [name], [name], MD      . traMADol (ULTRAM) tablet 50 mg  50 mg Oral Q6H PRN [name], [name], MD         Allergies: Allergies  Allergen Reactions  . Other     esbriet--severe GI side effects causing admission to hospital  . Codeine Other (See Comments)    Syncope   . Penicillins Other (See Comments)    Unknown reaction  Has patient had a PCN reaction causing immediate rash, facial/tongue/throat swelling, SOB or lightheadedness with hypotension: Unknown Has patient had a PCN reaction causing severe rash involving mucus membranes or skin necrosis: Unknown Has patient had a PCN reaction that required hospitalization: Unknown Has patient had a PCN reaction occurring within the last 10 years: Unknown If all of the above answers are "NO", then may proceed with Cephalosporin use.  . Sulfonamide Derivatives Rash    Social History: Social History  Substance Use Topics  . Smoking status: Former Smoker    Packs/day: 1.00    Years: 25.00    Types: Cigarettes    Quit date: 10/14/1999  . Smokeless tobacco: Never Used  . Alcohol use No    Family History Family History  Problem Relation Age of Onset  . Asthma Mother   . Stomach cancer Sister   . Colon cancer Son     Review of Systems 10 systems were reviewed and are negative except as noted specifically in the HPI.  Objective   Vital signs in last 24 hours: BP 115/83 (BP Location: Left Arm)   Pulse 84   Temp 97.8 F (36.6 C) (Oral)   Resp 18   Ht 5\' 3"  (1.6 m)   Wt 53.1 kg (117 lb 1 oz)   LMP  (LMP Unknown)   SpO2 98%   BMI 20.74 kg/m   Intake/Output last 3 shifts: I/O last 3 completed shifts: In: 523.3 [I.V.:473.3; IV Piggyback:50] Out: 250 [Urine:250]  Physical Exam General: NAD, A&O, resting, appropriate HEENT: [place]/AT, EOMI, MMM Pulmonary: Normal work of breathing on [place] Cardiovascular: HDS, adequate peripheral perfusion Abdomen: soft, NTTP, nondistended, no suprapubic fullness or tenderness. General mild tenderness to palpation.  GU: no bilateral CVA tenderness Extremities: warm and well perfused, no  edema Neuro: Appropriate, no focal neurological deficits  Most Recent Labs: Lab Results  Component Value Date   WBC 17.4 (H) 05/23/2017   HGB 11.3 (L) 05/23/2017   HCT 33.5 (L) 05/23/2017   PLT 277 05/23/2017    Lab Results  Component Value Date   NA 129 (L) 05/23/2017   K 5.0 05/23/2017   CL 95 (L) 05/23/2017   CO2 27 05/23/2017   BUN 8 05/23/2017   CREATININE 0.70 05/23/2017   CALCIUM 8.6 (L) 05/23/2017    Lab Results  Component Value Date   ALKPHOS 85 05/22/2017   BILITOT 0.7 05/22/2017   BILIDIR 0.1 12/04/2015   PROT 6.6 05/22/2017  ALBUMIN 3.0 (L) 05/22/2017   ALT 15 05/22/2017   AST 25 05/22/2017    Lab Results  Component Value Date   INR 1.14 05/22/2017     Urine Culture: None here    IMAGING: No results found.  ------  Assessment:  Patient is a 76 y.o. female transferred from OSH with around 1 week of treated UTI with ongoing poor tolerance to PO, nausea and vomiting with intermittent abdominal pain. CT reviewed, demonstrates 8mm right proximal UPJ stone with resulting right hydroureteronephrosis to the level of the stone.   Given recent UTI and ongoing leukocytosis, in addition to lack of patient's clinical improvement over last week of hospitalization, prefer to intervene with drainage of the right collecting system. Discussed this with patient this AM, who is in agreement to proceed. Discussed risks and benefits of right ureteral stent placement, including small but possible risk of worsening infection, ureteral or kidney injury, and inability to pass ureteral stent which would necessitate peructaneous neprhostomy tube placement.   Recommendations: 1. Continue NPO 2. Will take patient to operating room today for cystourethroscopy, right ureteral stent placement, right retrograde pyelogram 3. Continue cefepime, can narrowing following surgical intervention to ceftriaxone given OSH culture results.    Thank you for this consult. Please contact the  urology consult pager with any further questions/concerns.  Callie Fielding, MD Urology Surgical Resident   I have seen and evaluated the patient and I agree with Dr. Robinette Haines assessment and plan.  Carol Aye  -----

## 2017-06-17 NOTE — Brief Op Note (Signed)
06/17/2017  2:02 PM  PATIENT:  [name]  76 y.o. female  PRE-OPERATIVE DIAGNOSIS:  RIGHT RENAL CALCULUS  POST-OPERATIVE DIAGNOSIS:  * No post-op diagnosis entered *  PROCEDURE:  Procedure(s): CYSTOSCOPY/RETROGRADE/URETEROSCOPY/ RIGHT STENT EXCHANGE (Right) HOLMIUM LASER APPLICATION (Right)  SURGEON:  Surgeon(s) and Role:    * Capria Cartaya, Mardene CelestePatrick L, MD - Primary  PHYSICIAN ASSISTANT:   ASSISTANTS: none   ANESTHESIA:   general  EBL:  Total I/O In: 300 [I.V.:300] Out: 5 [Blood:5]  BLOOD ADMINISTERED:none  DRAINS: 6x26 JJ stent with tether   LOCAL MEDICATIONS USED:  NONE  SPECIMEN:  Source of Specimen:  right ureteral stone  DISPOSITION OF SPECIMEN:  PATHOLOGY  COUNTS:  YES  TOURNIQUET:  * No tourniquets in log *  DICTATION: .Note written in EPIC  PLAN OF CARE: Discharge to home after PACU  PATIENT DISPOSITION:  PACU - hemodynamically stable.   Delay start of Pharmacological VTE agent (>24hrs) due to surgical blood loss or risk of bleeding: not applicable

## 2017-06-17 NOTE — Interval H&P Note (Signed)
History and Physical Interval Note:  06/17/2017 12:56 PM  [name]  has presented today for surgery, with the diagnosis of RIGHT RENAL CALCULUS  The various methods of treatment have been discussed with the patient and family. After consideration of risks, benefits and other options for treatment, the patient has consented to  Procedure(s) with comments: CYSTOSCOPY/RETROGRADE/URETEROSCOPY (Right) - 1 HR  NEEDS DIGITAL URETEROSCOPE [phone] [employer] GOLD MEDICARE-[id number] HOLMIUM LASER APPLICATION (Right) as a surgical intervention .  The patient's history has been reviewed, patient examined, no change in status, stable for surgery.  I have reviewed the patient's chart and labs.  Questions were answered to the patient's satisfaction.     Wilkie AyePatrick Minervia Osso

## 2017-06-18 ENCOUNTER — Encounter (HOSPITAL_COMMUNITY): Payer: Self-pay | Admitting: Urology

## 2017-06-22 NOTE — Op Note (Addendum)
Preoperative diagnosis: Right ureteral stone  Postoperative diagnosis: Same  Procedure: 1 cystoscopy 2 right retrograde pyelography 3.  Intraoperative fluoroscopy, under one hour, with interpretation 4.  Right ureteroscopic stone manipulation with laser lithotripsy 5.  Right 6 x 26 JJ stent exchange  Attending: Cleda MccreedyPatrick Mackenzie  Anesthesia: General  Estimated blood loss: None  Drains: Right 6 x 26 JJ ureteral stent with tether  Specimens: stone for analysis  Antibiotics: rocephin  Findings: right proximal ureteral calculus. No hydronephrosis. No masses/lesions in the bladder.  Indications: Patient is a 76 year old female with a history of ureteral stone and who has failed medical expulsive therapy and had a stent placed for sepsis.  After discussing treatment options, she decided proceed with right ureteroscopic stone manipulation.  Procedure her in detail: The patient was brought to the operating room and a brief timeout was done to ensure correct patient, correct procedure, correct site.  General anesthesia was administered patient was placed in dorsal lithotomy position.  Her genitalia was then prepped and draped in usual sterile fashion.  A rigid 22 French cystoscope was passed in the urethra and the bladder.  Bladder was inspected free masses or lesions.  the right ureteral orifices were in the normal orthotopic locations. Using a grasper the ureteral stent was brought to the urethral meatus. A zipwire was then advanced through the stent and up to the renal pelvis. The stent was then removed. a 6 french ureteral catheter was then instilled into the right ureter orifice.  a gentle retrograde was obtained and findings noted above.    we then removed the cystoscope and cannulated the right ureteral orifice with a semirigid ureteroscope.  we then encountered the stone in the proximal ureter.  using using a 200 nm laser fiber and fragmented the stone into smaller pieces.  the pieces were  then removed with a engage basket.  once all stone fragments were removed we then placed a 6 x 26 double-j ureteral stent over the original zip wire. We then removed the wire and good coil was noted in the the renal pelvis under fluoroscopy and the bladder under direct vision.     the stone fragments were then removed from the bladder and sent for analysis.   the bladder was then drained and this concluded the procedure which was well tolerated by patient.  Complications: None  Condition: Stable, extubated, transferred to PACU  Plan: Patient is to be discharged home as to follow-up in 2 weeks. She is to remove her stent by pulling the tether in 72 hours.

## 2017-06-23 LAB — STONE ANALYSIS
Ca Oxalate,Dihydrate: 5 %
Ca Oxalate,Monohydr.: 90 %
Ca phos cry stone ql IR: 5 %
STONE WEIGHT KSTONE: 7.9 mg

## 2017-06-24 ENCOUNTER — Ambulatory Visit (INDEPENDENT_AMBULATORY_CARE_PROVIDER_SITE_OTHER): Payer: Medicare HMO | Admitting: Urology

## 2017-06-24 DIAGNOSIS — N200 Calculus of kidney: Secondary | ICD-10-CM | POA: Diagnosis not present

## 2017-06-26 ENCOUNTER — Ambulatory Visit: Payer: Medicare HMO | Admitting: Pulmonary Disease

## 2017-07-01 ENCOUNTER — Encounter: Payer: Self-pay | Admitting: Acute Care

## 2017-07-01 ENCOUNTER — Other Ambulatory Visit (INDEPENDENT_AMBULATORY_CARE_PROVIDER_SITE_OTHER): Payer: Medicare HMO

## 2017-07-01 ENCOUNTER — Ambulatory Visit (INDEPENDENT_AMBULATORY_CARE_PROVIDER_SITE_OTHER): Payer: Medicare HMO | Admitting: Acute Care

## 2017-07-01 ENCOUNTER — Ambulatory Visit (INDEPENDENT_AMBULATORY_CARE_PROVIDER_SITE_OTHER)
Admission: RE | Admit: 2017-07-01 | Discharge: 2017-07-01 | Disposition: A | Discharge status: Home / Self Care | Payer: Medicare HMO | Source: Ambulatory Visit | Attending: Acute Care | Admitting: Acute Care

## 2017-07-01 VITALS — BP 108/70 | HR 93 | Ht 65.0 in | Wt 111.2 lb

## 2017-07-01 DIAGNOSIS — J9611 Chronic respiratory failure with hypoxia: Secondary | ICD-10-CM

## 2017-07-01 DIAGNOSIS — J84112 Idiopathic pulmonary fibrosis: Secondary | ICD-10-CM

## 2017-07-01 DIAGNOSIS — R0600 Dyspnea, unspecified: Secondary | ICD-10-CM | POA: Diagnosis not present

## 2017-07-01 DIAGNOSIS — Z5181 Encounter for therapeutic drug level monitoring: Secondary | ICD-10-CM | POA: Diagnosis not present

## 2017-07-01 LAB — HEPATIC FUNCTION PANEL
ALT: 9 U/L (ref 0–35)
AST: 17 U/L (ref 0–37)
Albumin: 3.5 g/dL (ref 3.5–5.2)
Alkaline Phosphatase: 91 U/L (ref 39–117)
BILIRUBIN DIRECT: 0 mg/dL (ref 0.0–0.3)
BILIRUBIN TOTAL: 0.3 mg/dL (ref 0.2–1.2)
TOTAL PROTEIN: 6.8 g/dL (ref 6.0–8.3)

## 2017-07-01 NOTE — Assessment & Plan Note (Signed)
Wear your oxygen to maintain oxygen saturations above 88%. Wear the continuous flow oxygen if the pulsed won't maintain you at greater than 88%. Call us if you need [place] to order the small continuous flow oxygen tanks. Follow up with Dr. Kendrick Fries in 4 months. Please contact office for sooner follow up if symptoms do not improve or worsen or seek emergency care

## 2017-07-01 NOTE — Assessment & Plan Note (Signed)
Currently off OFEV : held 2/2 hospitalization and nausea/vomiting/weight loss Plan: We will do Liver Function Tests today. We will call you with results and then determine timing to restart OFEV. Follow up with Dr. Kendrick Fries in 4 months or before if needed.. Please contact office for sooner follow up if symptoms do not improve or worsen or seek emergency care

## 2017-07-01 NOTE — Progress Notes (Signed)
History of Present Illness [name] is a 76 y.o. female former smoker with pulmonary fibrosis. She is followed by Dr. Lake Bells.   Pt. Has had +exposure to macrodantin (but not on chronically)  07/01/2017 Follow Up after hospitalization for Right nephrolithiasis with hydroureteronephrosis: s/p ureteral stent placement 8/11.   Pt.  was seen by Dr. Lake Bells on 05/15/2017 and sent directly to the ED for nausea , and vomiting, inability to eat and concern for small bowel obstruction. She was seen in the ED that same day, given one liter of IVF and then sent home. She was seen by her PCP for continued nausea and vomiting on 8/6. She was admitted to the hospital that day. She required hospitalization from  05/18/2017-05/22/2017 at [facility] and then was transferred to [facility] 8/10-8/12 for stent placement.She was treated with IV antibiotics,stent was removed 06/17/2017, and she presents today with significant improvement. Her weight had dropped during hospitalization to 105 lbs. She has gained 6 pounds back since discharge. She is able to eat now without difficulty, and she has a good appetite. Nausea has resolved. She has noted worsening dyspnea with exertion. She states she is doing well at rest, but she is feeling shortness of breath with exertion. Today on arrival to the office saturations were 73% on 3 L pulsed oxygen.She was placed on continuous oxygen and saturations rebounded to 95%. We discussed the need to increase pulsed oxygen to 4L, and if her saturations are not at least 88%, she needs to use the continuous oxygen. We discussed the role deconditioning may be playing in her dyspnea.She states she has been much less active after her hospitalization. She remains off OFEV, and is anxious to restart therapy. Her PCP has not checked LFT's in awhile, but he does monitor her labs while on OFEV. She denies cough, fever, chest pain, orthopnea or hemoptysis.   Test Results: CXR 07/01/2017>>      Hepatic Function Latest Ref Rng & Units 07/01/2017 05/22/2017 12/04/2015  Total Protein 6.0 - 8.3 g/dL 6.8 6.6 7.3  Albumin 3.5 - 5.2 g/dL 3.5 3.0(L) 4.0  AST 0 - 37 U/L '17 25 22  ' ALT 0 - 35 U/L '9 15 17  ' Alk Phosphatase 39 - 117 U/L 91 85 103  Total Bilirubin 0.2 - 1.2 mg/dL 0.3 0.7 0.3  Bilirubin, Direct 0.0 - 0.3 mg/dL 0.0 - 0.1    CBC Latest Ref Rng & Units 05/24/2017 05/23/2017 05/22/2017  WBC 4.0 - 10.5 K/uL 15.6(H) 17.4(H) 21.3(H)  Hemoglobin 12.0 - 15.0 g/dL 11.3(L) 11.3(L) 11.6(L)  Hematocrit 36.0 - 46.0 % 33.6(L) 33.5(L) 34.0(L)  Platelets 150 - 400 K/uL 292 277 266    BMP Latest Ref Rng & Units 05/24/2017 05/23/2017 05/22/2017  Glucose 65 - 99 mg/dL 87 76 91  BUN 6 - 20 mg/dL '9 8 6  ' Creatinine 0.44 - 1.00 mg/dL 0.67 0.70 0.63  Sodium 135 - 145 mmol/L 135 129(L) 128(L)  Potassium 3.5 - 5.1 mmol/L 4.2 5.0 4.4  Chloride 101 - 111 mmol/L 100(L) 95(L) 94(L)  CO2 22 - 32 mmol/L '28 27 26  ' Calcium 8.9 - 10.3 mg/dL 8.6(L) 8.6(L) 8.7(L)     PFT    Component Value Date/Time   FEV1PRE 1.01 11/10/2016 1402   FEV1POST 1.06 11/10/2016 1402   FVCPRE 1.45 05/01/2016 1253   FVCPOST 1.22 11/10/2016 1402   TLC 2.25 11/10/2016 1402   DLCOUNC 5.58 11/10/2016 1402   PREFEV1FVCRT 80 11/10/2016 1402   PSTFEV1FVCRT 87 11/10/2016  22      Past medical hx Past Medical History:  Diagnosis Date  . Allergic rhinitis   . CAD (coronary artery disease)    HepaCoat stent to proximal RCA 2004 with acute MI  . Complication of anesthesia   . Essential hypertension   . GERD (gastroesophageal reflux disease)   . History of acute inferior wall MI    Associated with RV infarct and cardiogenic shock 2004   . Hyperlipidemia   . Idiopathic pulmonary fibrosis ([place])    Followed by Dr. Chase Caller  . PONV (postoperative nausea and vomiting)      Social History  Substance Use Topics  . Smoking status: Former Smoker    Packs/day: 1.00    Years: 25.00    Types: Cigarettes    Quit date: 10/14/1999  .  Smokeless tobacco: Never Used  . Alcohol use No    [name] reports that she quit smoking about 17 years ago. Her smoking use included Cigarettes. She has a 25.00 pack-year smoking history. She has never used smokeless tobacco. She reports that she does not drink alcohol or use drugs.  Tobacco Cessation: Counseling given: Yes Former smoker, 25 pack year smoking history, quit 2001 , but husband is a current smoker  Past surgical hx, Family hx, Social hx all reviewed.  Current Outpatient Prescriptions on File Prior to Visit  Medication Sig  . aspirin EC 81 MG tablet Take 162 mg by mouth daily.  . cefpodoxime (VANTIN) 100 MG tablet Take 1 tablet (100 mg total) by mouth 2 (two) times daily.  Marland Kitchen desmopressin (DDAVP) 0.2 MG tablet Take 0.2 mg by mouth at bedtime.   Marland Kitchen HYDROcodone-acetaminophen (NORCO) 5-325 MG tablet Take 1 tablet by mouth every 6 (six) hours as needed for moderate pain.  . metoprolol succinate (TOPROL-XL) 50 MG 24 hr tablet Take 50 mg by mouth every evening. Take with or immediately following a meal.   . mirtazapine (REMERON) 30 MG tablet Take 30 mg by mouth daily with lunch.  Marland Kitchen MYRBETRIQ 50 MG TB24 tablet Take 50 mg by mouth daily.   Marland Kitchen omeprazole (PRILOSEC) 20 MG capsule Take 20 mg by mouth daily.  . ondansetron (ZOFRAN) 4 MG tablet take 1 tablet by mouth every 6 hours if needed for nausea and vomiting  . promethazine (PHENERGAN) 12.5 MG tablet Take 1 tablet (12.5 mg total) by mouth every 6 (six) hours as needed for nausea or vomiting.  . simvastatin (ZOCOR) 40 MG tablet Take 40 mg by mouth every evening.   . traMADol (ULTRAM) 50 MG tablet Take 50 mg by mouth 3 (three) times daily. For cough by Dr. Olena Heckle   No current facility-administered medications on file prior to visit.      Allergies  Allergen Reactions  . Pirfenidone Other (See Comments)    (Esbriet)-severe GI upset resulted in patient being admitted to hospital.  . Codeine Other (See Comments)    Syncope   .  Penicillins Other (See Comments)    Unknown reaction  Has patient had a PCN reaction causing immediate rash, facial/tongue/throat swelling, SOB or lightheadedness with hypotension: Unknown Has patient had a PCN reaction causing severe rash involving mucus membranes or skin necrosis: Unknown Has patient had a PCN reaction that required hospitalization: Unknown Has patient had a PCN reaction occurring within the last 10 years: Unknown If all of the above answers are "NO", then may proceed with Cephalosporin use.  . Sulfonamide Derivatives Rash    Review Of Systems:  Constitutional:   +  weight loss,No  night sweats,  Fevers, chills, fatigue, or  lassitude.  HEENT:   No headaches,  Difficulty swallowing,  Tooth/dental problems, or  Sore throat,                No sneezing, itching, ear ache, nasal congestion, post nasal drip,   CV:  No chest pain,  Orthopnea, PND, swelling in lower extremities, anasarca, dizziness, palpitations, syncope.   GI  No heartburn, indigestion, abdominal pain, nausea, vomiting, diarrhea, change in bowel habits, loss of appetite, bloody stools.   Resp: + shortness of breath with exertion less  at rest.  No excess mucus, no productive cough,  No non-productive cough,  No coughing up of blood.  No change in color of mucus.  No wheezing.  No chest wall deformity  Skin: no rash or lesions.  GU: no dysuria, change in color of urine, no urgency or frequency.  No flank pain, no hematuria   MS:  No joint pain or swelling.  No decreased range of motion.  No back pain.  Psych:  No change in mood or affect. No depression or anxiety.  No memory loss.   Vital Signs BP 108/70 (BP Location: Left Arm, Cuff Size: Normal)   Pulse 93   Ht '5\' 5"'  (1.651 m)   Wt 111 lb 3.2 oz (50.4 kg)   LMP  (LMP Unknown)   SpO2 95%   BMI 18.50 kg/m    Physical Exam:  General- No distress,  A&Ox3, pleasant ENT: No sinus tenderness, TM clear, pale nasal mucosa, no oral exudate,no post  nasal drip, no LAN Cardiac: S1, S2, regular rate and rhythm, no murmur Chest: No wheeze/ + crackles / no dullness; + accessory muscle use, no nasal flaring, no sternal retractions Abd.: Soft Non-tender, non-distended, BS + Ext: No clubbing cyanosis, edema Neuro:  Cranial nerves intact, deconditioned at baseline Skin: No rashes, warm and dry Psych: normal mood and behavior   Assessment/Plan  Chronic hypoxemic respiratory failure (HCC) Wear your oxygen to maintain oxygen saturations above 88%. Wear the continuous flow oxygen if the pulsed won't maintain you at greater than 88%. Call us if you need [place] to order the small continuous flow oxygen tanks. Follow up with Dr. Lake Bells in 4 months. Please contact office for sooner follow up if symptoms do not improve or worsen or seek emergency care    IPF (idiopathic pulmonary fibrosis) ([place]) Currently off OFEV Worsening dyspnea since recent admission Plan We will do a CXR today. We will call you with results. We will do Liver Function Tests today. We will call you with results and then determine timing to restart OFEV. Let us know if you change your mind about Pulmonary rehab. Slowly increase your activity. Wear your oxygen to maintain oxygen saturations above 88%. Wear the continuous flow oxygen if the pulsed won't maintain you at greater than 88%. Call us if you need [place] to order the small continuous flow oxygen tanks. Follow up with Dr. Lake Bells in 4 months. Please contact office for sooner follow up if symptoms do not improve or worsen or seek emergency care     Encounter for therapeutic drug monitoring Currently off OFEV : held 2/2 hospitalization and nausea/vomiting/weight loss Plan: We will do Liver Function Tests today. We will call you with results and then determine timing to restart OFEV. Follow up with Dr. Lake Bells in 4 months or before if needed.. Please contact office for sooner follow up if symptoms do not improve or  worsen or seek emergency care      Magdalen Spatz, NP 07/01/2017  10:24 PM

## 2017-07-01 NOTE — Patient Instructions (Addendum)
It is nice to meet you today. We will do a CXR today. We will call you with results. Let us know if you change your mind about Pulmonary rehab. Slowly increase your activity. Wear your oxygen to maintain oxygen saturations above 88%. Wear the continuous flow oxygen if the pulsed won't maintain you at greater than 88%. Call us if you need [place] to order the small continuous flow oxygen tanks. Follow up with Dr. Kendrick Fries in 4 months. Please contact office for sooner follow up if symptoms do not improve or worsen or seek emergency care     We will do Liver Function Tests today. We will call you with results and then determine timing to restart OFEV. Follow up with Dr. Kendrick Fries in 4 months or before if needed.. Please contact office for sooner follow up if symptoms do not improve or worsen or seek emergency care

## 2017-07-01 NOTE — Assessment & Plan Note (Addendum)
Currently off OFEV Worsening dyspnea since recent admission Plan We will do a CXR today. We will call you with results. We will do Liver Function Tests today. We will call you with results and then determine timing to restart OFEV. Let us know if you change your mind about Pulmonary rehab. Slowly increase your activity. Wear your oxygen to maintain oxygen saturations above 88%. Wear the continuous flow oxygen if the pulsed won't maintain you at greater than 88%. Call us if you need [place] to order the small continuous flow oxygen tanks. Follow up with Dr. Kendrick Fries in 4 months. Please contact office for sooner follow up if symptoms do not improve or worsen or seek emergency care

## 2017-07-04 ENCOUNTER — Encounter: Payer: Self-pay | Admitting: Pulmonary Disease

## 2017-07-05 DIAGNOSIS — R05 Cough: Secondary | ICD-10-CM | POA: Diagnosis not present

## 2017-07-05 DIAGNOSIS — J8410 Pulmonary fibrosis, unspecified: Secondary | ICD-10-CM | POA: Diagnosis not present

## 2017-07-05 DIAGNOSIS — I10 Essential (primary) hypertension: Secondary | ICD-10-CM | POA: Diagnosis not present

## 2017-07-06 NOTE — Progress Notes (Signed)
Reviewed, agree 

## 2017-07-10 NOTE — Addendum Note (Signed)
Addendum  created 07/10/17 1610 by Atilano Median, CRNA   Anesthesia Intra Meds edited

## 2017-07-14 ENCOUNTER — Other Ambulatory Visit: Payer: Self-pay | Admitting: Urology

## 2017-07-14 DIAGNOSIS — N200 Calculus of kidney: Secondary | ICD-10-CM

## 2017-07-16 ENCOUNTER — Encounter: Payer: Self-pay | Admitting: Pulmonary Disease

## 2017-07-16 DIAGNOSIS — J84112 Idiopathic pulmonary fibrosis: Secondary | ICD-10-CM

## 2017-07-16 DIAGNOSIS — J9611 Chronic respiratory failure with hypoxia: Secondary | ICD-10-CM

## 2017-07-20 ENCOUNTER — Ambulatory Visit (HOSPITAL_COMMUNITY)
Admission: RE | Admit: 2017-07-20 | Discharge: 2017-07-20 | Disposition: A | Discharge status: Home / Self Care | Payer: Medicare HMO | Source: Ambulatory Visit | Attending: Urology | Admitting: Urology

## 2017-07-20 DIAGNOSIS — R9341 Abnormal radiologic findings on diagnostic imaging of renal pelvis, ureter, or bladder: Secondary | ICD-10-CM | POA: Insufficient documentation

## 2017-07-20 DIAGNOSIS — N200 Calculus of kidney: Secondary | ICD-10-CM

## 2017-07-21 DIAGNOSIS — K210 Gastro-esophageal reflux disease with esophagitis: Secondary | ICD-10-CM | POA: Diagnosis not present

## 2017-07-21 DIAGNOSIS — J449 Chronic obstructive pulmonary disease, unspecified: Secondary | ICD-10-CM | POA: Diagnosis not present

## 2017-07-21 DIAGNOSIS — I10 Essential (primary) hypertension: Secondary | ICD-10-CM | POA: Diagnosis not present

## 2017-07-21 DIAGNOSIS — G459 Transient cerebral ischemic attack, unspecified: Secondary | ICD-10-CM | POA: Diagnosis not present

## 2017-07-21 DIAGNOSIS — I2510 Atherosclerotic heart disease of native coronary artery without angina pectoris: Secondary | ICD-10-CM | POA: Diagnosis not present

## 2017-07-21 DIAGNOSIS — Z9189 Other specified personal risk factors, not elsewhere classified: Secondary | ICD-10-CM | POA: Diagnosis not present

## 2017-07-21 DIAGNOSIS — E782 Mixed hyperlipidemia: Secondary | ICD-10-CM | POA: Diagnosis not present

## 2017-07-21 NOTE — Telephone Encounter (Signed)
Patient is asking for a refill on her albuterol. Sent a message to verify her pharmacy before sending in the RX.

## 2017-07-24 DIAGNOSIS — K219 Gastro-esophageal reflux disease without esophagitis: Secondary | ICD-10-CM | POA: Diagnosis not present

## 2017-07-24 DIAGNOSIS — M1612 Unilateral primary osteoarthritis, left hip: Secondary | ICD-10-CM | POA: Diagnosis not present

## 2017-07-24 DIAGNOSIS — J301 Allergic rhinitis due to pollen: Secondary | ICD-10-CM | POA: Diagnosis not present

## 2017-07-24 DIAGNOSIS — J849 Interstitial pulmonary disease, unspecified: Secondary | ICD-10-CM | POA: Diagnosis not present

## 2017-07-24 DIAGNOSIS — Z23 Encounter for immunization: Secondary | ICD-10-CM | POA: Diagnosis not present

## 2017-07-24 DIAGNOSIS — I25111 Atherosclerotic heart disease of native coronary artery with angina pectoris with documented spasm: Secondary | ICD-10-CM | POA: Diagnosis not present

## 2017-07-24 DIAGNOSIS — E782 Mixed hyperlipidemia: Secondary | ICD-10-CM | POA: Diagnosis not present

## 2017-07-24 DIAGNOSIS — I10 Essential (primary) hypertension: Secondary | ICD-10-CM | POA: Diagnosis not present

## 2017-07-24 DIAGNOSIS — Z0001 Encounter for general adult medical examination with abnormal findings: Secondary | ICD-10-CM | POA: Diagnosis not present

## 2017-07-28 ENCOUNTER — Other Ambulatory Visit: Payer: Self-pay

## 2017-07-28 MED ORDER — ALBUTEROL SULFATE HFA 108 (90 BASE) MCG/ACT IN AERS: 1.0000 | INHALATION_SPRAY | Freq: Four times a day (QID) | RESPIRATORY_TRACT | 3 refills | Status: AC | PRN

## 2017-08-04 DIAGNOSIS — J8410 Pulmonary fibrosis, unspecified: Secondary | ICD-10-CM | POA: Diagnosis not present

## 2017-08-04 DIAGNOSIS — I10 Essential (primary) hypertension: Secondary | ICD-10-CM | POA: Diagnosis not present

## 2017-08-04 DIAGNOSIS — R05 Cough: Secondary | ICD-10-CM | POA: Diagnosis not present

## 2017-08-05 ENCOUNTER — Ambulatory Visit (INDEPENDENT_AMBULATORY_CARE_PROVIDER_SITE_OTHER): Payer: Medicare HMO | Admitting: Urology

## 2017-08-05 DIAGNOSIS — N200 Calculus of kidney: Secondary | ICD-10-CM | POA: Diagnosis not present

## 2017-08-05 LAB — PULMONARY FUNCTION TEST
DL/VA % pred: 65 %
DL/VA: 3.09 ml/min/mmHg/L
DLCO COR % PRED: 25 %
DLCO COR: 5.79 ml/min/mmHg
DLCO UNC % PRED: 24 %
DLCO UNC: 5.58 ml/min/mmHg
FEF 25-75 POST: 1.42 L/s
FEF 25-75 PRE: 0.98 L/s
FEF2575-%Change-Post: 44 %
FEF2575-%PRED-PRE: 61 %
FEF2575-%Pred-Post: 89 %
FEV1-%CHANGE-POST: 4 %
FEV1-%PRED-POST: 52 %
FEV1-%Pred-Pre: 50 %
FEV1-POST: 1.06 L
FEV1-Pre: 1.01 L
FEV1FVC-%Change-Post: 7 %
FEV1FVC-%PRED-PRE: 107 %
FEV6-%CHANGE-POST: -2 %
FEV6-%PRED-POST: 48 %
FEV6-%PRED-PRE: 49 %
FEV6-POST: 1.22 L
FEV6-Pre: 1.25 L
FEV6FVC-%CHANGE-POST: 0 %
FEV6FVC-%PRED-POST: 105 %
FEV6FVC-%Pred-Pre: 104 %
FVC-%CHANGE-POST: -2 %
FVC-%Pred-Post: 45 %
FVC-%Pred-Pre: 47 %
FVC-Post: 1.22 L
POST FEV6/FVC RATIO: 100 %
PRE FEV1/FVC RATIO: 80 %
PRE FEV6/FVC RATIO: 100 %
Post FEV1/FVC ratio: 87 %
RV % pred: 44 %
RV: 0.99 L
TLC % PRED: 45 %
TLC: 2.25 L

## 2017-09-04 DIAGNOSIS — R05 Cough: Secondary | ICD-10-CM | POA: Diagnosis not present

## 2017-09-04 DIAGNOSIS — I10 Essential (primary) hypertension: Secondary | ICD-10-CM | POA: Diagnosis not present

## 2017-09-04 DIAGNOSIS — J8410 Pulmonary fibrosis, unspecified: Secondary | ICD-10-CM | POA: Diagnosis not present

## 2017-10-02 ENCOUNTER — Other Ambulatory Visit: Payer: Self-pay | Admitting: Urology

## 2017-10-02 DIAGNOSIS — N200 Calculus of kidney: Secondary | ICD-10-CM

## 2017-10-04 DIAGNOSIS — J8410 Pulmonary fibrosis, unspecified: Secondary | ICD-10-CM | POA: Diagnosis not present

## 2017-10-04 DIAGNOSIS — R05 Cough: Secondary | ICD-10-CM | POA: Diagnosis not present

## 2017-10-04 DIAGNOSIS — I10 Essential (primary) hypertension: Secondary | ICD-10-CM | POA: Diagnosis not present

## 2017-10-22 ENCOUNTER — Ambulatory Visit (HOSPITAL_COMMUNITY)
Admission: RE | Admit: 2017-10-22 | Discharge: 2017-10-22 | Disposition: A | Discharge status: Home / Self Care | Payer: Medicare HMO | Source: Ambulatory Visit | Attending: Urology | Admitting: Urology

## 2017-10-22 DIAGNOSIS — N281 Cyst of kidney, acquired: Secondary | ICD-10-CM | POA: Diagnosis not present

## 2017-10-22 DIAGNOSIS — N200 Calculus of kidney: Secondary | ICD-10-CM | POA: Diagnosis not present

## 2017-10-22 DIAGNOSIS — N210 Calculus in bladder: Secondary | ICD-10-CM | POA: Diagnosis not present

## 2017-11-03 ENCOUNTER — Other Ambulatory Visit: Payer: Self-pay | Admitting: Internal Medicine

## 2017-11-04 DIAGNOSIS — R05 Cough: Secondary | ICD-10-CM | POA: Diagnosis not present

## 2017-11-04 DIAGNOSIS — I10 Essential (primary) hypertension: Secondary | ICD-10-CM | POA: Diagnosis not present

## 2017-11-04 DIAGNOSIS — J8410 Pulmonary fibrosis, unspecified: Secondary | ICD-10-CM | POA: Diagnosis not present

## 2017-11-06 ENCOUNTER — Ambulatory Visit: Payer: Medicare HMO | Admitting: Pulmonary Disease

## 2017-11-06 ENCOUNTER — Other Ambulatory Visit (INDEPENDENT_AMBULATORY_CARE_PROVIDER_SITE_OTHER): Payer: Medicare HMO

## 2017-11-06 ENCOUNTER — Ambulatory Visit (INDEPENDENT_AMBULATORY_CARE_PROVIDER_SITE_OTHER)
Admission: RE | Admit: 2017-11-06 | Discharge: 2017-11-06 | Disposition: A | Discharge status: Home / Self Care | Payer: Medicare HMO | Source: Ambulatory Visit | Attending: Pulmonary Disease | Admitting: Pulmonary Disease

## 2017-11-06 VITALS — BP 114/66 | HR 112 | Ht 65.0 in | Wt 101.0 lb

## 2017-11-06 DIAGNOSIS — J9611 Chronic respiratory failure with hypoxia: Secondary | ICD-10-CM

## 2017-11-06 DIAGNOSIS — J84112 Idiopathic pulmonary fibrosis: Secondary | ICD-10-CM

## 2017-11-06 DIAGNOSIS — R0609 Other forms of dyspnea: Secondary | ICD-10-CM | POA: Diagnosis not present

## 2017-11-06 DIAGNOSIS — J8410 Pulmonary fibrosis, unspecified: Secondary | ICD-10-CM | POA: Diagnosis not present

## 2017-11-06 LAB — CBC WITH DIFFERENTIAL/PLATELET
Basophils Absolute: 0.1 10*3/uL (ref 0.0–0.1)
Basophils Relative: 0.5 % (ref 0.0–3.0)
EOS PCT: 0.1 % (ref 0.0–5.0)
Eosinophils Absolute: 0 10*3/uL (ref 0.0–0.7)
HEMATOCRIT: 37.7 % (ref 36.0–46.0)
Hemoglobin: 12.5 g/dL (ref 12.0–15.0)
LYMPHS ABS: 1.6 10*3/uL (ref 0.7–4.0)
LYMPHS PCT: 9 % — AB (ref 12.0–46.0)
MCHC: 33.2 g/dL (ref 30.0–36.0)
MCV: 87.7 fl (ref 78.0–100.0)
MONOS PCT: 1.6 % — AB (ref 3.0–12.0)
Monocytes Absolute: 0.3 10*3/uL (ref 0.1–1.0)
NEUTROS ABS: 15.5 10*3/uL — AB (ref 1.4–7.7)
NEUTROS PCT: 88.8 % — AB (ref 43.0–77.0)
Platelets: 431 10*3/uL — ABNORMAL HIGH (ref 150.0–400.0)
RBC: 4.3 Mil/uL (ref 3.87–5.11)
RDW: 13.5 % (ref 11.5–15.5)
WBC: 17.5 10*3/uL — ABNORMAL HIGH (ref 4.0–10.5)

## 2017-11-06 NOTE — Progress Notes (Signed)
Subjective:    Patient ID: [name], female    DOB: 09/24/1941, 77 y.o.   MRN: [id number]  Synopsis: Former [facility] patient with fibrosis Episode of ARDS 2004, +exposure to macrodantin (but not on chronically) Started Ofev 2018   HPI Chief Complaint  Patient presents with  . Follow-up    pt c/o increased sob with any exertion, runny nose, prod cough with clear mucus.    She ahd a stent after the last visit: they put a stent in her ureter now.  She has been followed by the urology team for a while since then.  She had a follow up ultrasound up at [facility].  Apparently she has something still in her ureter that needs to be addressed.  IPF: > she says her breathing is terrible > she takes Ofev only when she remembers > Ofev isn't giving her any trouble > She says she has plenty of them at home right now   Chronic respiratory failure with hypoxemia:  > She is still using and benefitting from oxygen at 3LPM > she has tried turning up, but this didn't help  Her shortness of breath has dramatically worsened over the last several weeks.  She says she cannot walk any more than about 50 feet without having to stop to catch her breath.  She also says that her appetite is terrible.  She denies any swelling recently.  No cough no chest congestion.  Past Medical History:  Diagnosis Date  . Allergic rhinitis   . CAD (coronary artery disease)    HepaCoat stent to proximal RCA 2004 with acute MI  . Complication of anesthesia   . Essential hypertension   . GERD (gastroesophageal reflux disease)   . History of acute inferior wall MI    Associated with RV infarct and cardiogenic shock 2004   . Hyperlipidemia   . Idiopathic pulmonary fibrosis (HCC)    Followed by [name]  . PONV (postoperative nausea and vomiting)       Review of Systems  Constitutional: Negative for diaphoresis, fatigue and fever.  HENT: Negative for postnasal drip, rhinorrhea and sinus pressure.     Respiratory: Positive for cough and shortness of breath. Negative for wheezing.   Cardiovascular: Negative for chest pain, palpitations and leg swelling.       Objective:   Physical Exam Vitals:   11/06/17 1535  BP: 114/66  Pulse: (!) 112  SpO2: 93%  Weight: 101 lb (45.8 kg)  Height: 5\' 5"  (1.651 m)   3L   Gen: chronically ill appearing, pale HENT: OP clear, TM's clear, neck supple PULM: few crackles bases B, normal percussion CV: RRR, no mgr, trace edema GI: BS+, soft, nontender Derm: no cyanosis or rash Psyche: normal mood and affect    Labs: Autoimmune labwork 10/2014:  Negative  Chest imaging: CT chest 08/2014:  Volume loss left hemithorax, fibrotic changes L > R, subpleural reticulation with a few areas of honeycombing.  (progressive by CXR from 2009) Barium swallow 2016:  +esophageal dysmotility and +aspiration noted. >> referred to speech.  04/2015 Modified barium swallow: no clear aspiration 04/2015 CT chest (HRCT) > worsening pulmonary fibrosis changes in bases L>R consistent with UIP (Entrikin read)  PFT PFT"s 2009:  No obstruction, TLC 3.23 (70%), DLCO 9.4 (50%) PFT"s 2016:  No obstruction, TLC 2.47 (50%), DLCO 7.90 (34%) July 2017 lung function testing ratio 88%, FVC 1.36 L (50% predicted), total lung capacity 2.63 L (53% predicted), DLCO 5.21 (22% predicted).  11/10/2016 FVC 1.26 L 47% predicted, FEV1 1.0 L 50% predicted. Total lung capacity 2.25 L 45% predicted, DLCO 5. 5.58 24 percent predicted  Records rewiewed in card everywhere, no recent labs available for review      Assessment & Plan:   UIP (usual interstitial pneumonitis) (HCC) - Plan: CBC w/Diff, DG Chest 2 View, Pulmonary function test  IPF (idiopathic pulmonary fibrosis) (HCC)  Chronic hypoxemic respiratory failure (HCC)  Dyspnea on exertion   Discussion: [name] appears to be much weaker today on physical exam then when I have seen her previously.  I am hopeful that this is just due  to her recent hospitalization and some deconditioning and not a sign of worsening lung disease.  She is complaining of worsening shortness of breath.  Differential diagnosis is broad and clearly includes worsening IPF but anemia and deconditioning could also be contributing.  Plan: IPF: Continue Ofev 100 mg twice a day We will arrange for a lung function test  Shortness of breath: We will check a complete blood count to make sure you are not anemic We will check a chest x-ray I want you to exercise more frequently and try to eat a bit more over the next few weeks If your lung function testing blood work and chest x-ray are stable when you come back on the next visit we will enroll you in pulmonary rehab  Chronic respiratory failure with hypoxemia: Continue 3 L of oxygen continuously  We will see you back in 2-4 weeks with a nurse practitioner    Current Outpatient Medications:  .  albuterol (PROVENTIL HFA;VENTOLIN HFA) 108 (90 Base) MCG/ACT inhaler, Inhale 1-2 puffs into the lungs every 6 (six) hours as needed for wheezing or shortness of breath., Disp: 1 Inhaler, Rfl: 3 .  aspirin EC 81 MG tablet, Take 162 mg by mouth daily., Disp: , Rfl:  .  cefpodoxime (VANTIN) 100 MG tablet, Take 1 tablet (100 mg total) by mouth 2 (two) times daily., Disp: 8 tablet, Rfl: 0 .  desmopressin (DDAVP) 0.2 MG tablet, Take 0.2 mg by mouth at bedtime. , Disp: , Rfl: 0 .  HYDROcodone-acetaminophen (NORCO) 5-325 MG tablet, Take 1 tablet by mouth every 6 (six) hours as needed for moderate pain., Disp: 30 tablet, Rfl: 0 .  metoprolol succinate (TOPROL-XL) 50 MG 24 hr tablet, Take 50 mg by mouth every evening. Take with or immediately following a meal. , Disp: , Rfl:  .  mirtazapine (REMERON) 30 MG tablet, Take 30 mg by mouth daily with lunch., Disp: , Rfl:  .  MYRBETRIQ 50 MG TB24 tablet, Take 50 mg by mouth daily. , Disp: , Rfl: 1 .  Nintedanib (OFEV) 150 MG CAPS, Take 150 mg by mouth 2 (two) times daily.,  Disp: , Rfl:  .  omeprazole (PRILOSEC) 20 MG capsule, Take 20 mg by mouth daily., Disp: , Rfl:  .  ondansetron (ZOFRAN) 4 MG tablet, take 1 tablet by mouth every 6 hours if needed for nausea and vomiting, Disp: 45 tablet, Rfl: 2 .  promethazine (PHENERGAN) 12.5 MG tablet, Take 1 tablet (12.5 mg total) by mouth every 6 (six) hours as needed for nausea or vomiting., Disp: 30 tablet, Rfl: 0 .  simvastatin (ZOCOR) 40 MG tablet, Take 40 mg by mouth every evening. , Disp: , Rfl:  .  traMADol (ULTRAM) 50 MG tablet, Take 50 mg by mouth 3 (three) times daily. For cough by Dr. Garner Nashaniels, Disp: , Rfl: 0

## 2017-11-06 NOTE — Patient Instructions (Signed)
IPF: Continue Ofev 100 mg twice a day We will arrange for a lung function test  Shortness of breath: We will check a complete blood count to make sure you are not anemic We will check a chest x-ray I want you to exercise more frequently and try to eat a bit more over the next few weeks If your lung function testing blood work and chest x-ray are stable when you come back on the next visit we will enroll you in pulmonary rehab  Chronic respiratory failure with hypoxemia: Continue 3 L of oxygen continuously  We will see you back in 2-4 weeks with a nurse practitioner

## 2017-11-16 ENCOUNTER — Other Ambulatory Visit: Payer: Self-pay

## 2017-11-16 MED ORDER — NINTEDANIB ESYLATE 100 MG PO CAPS: 100.0000 mg | ORAL_CAPSULE | Freq: Two times a day (BID) | ORAL | 11 refills | Status: AC

## 2017-11-29 IMAGING — DX DG CHEST 2V
2 series · 2 of 2 positions shown · non-contrast
Comparison: 05/22/2017

CLINICAL DATA: Dyspnea 2 months worsening.

EXAM:
CHEST  2 VIEW

[chest pa]
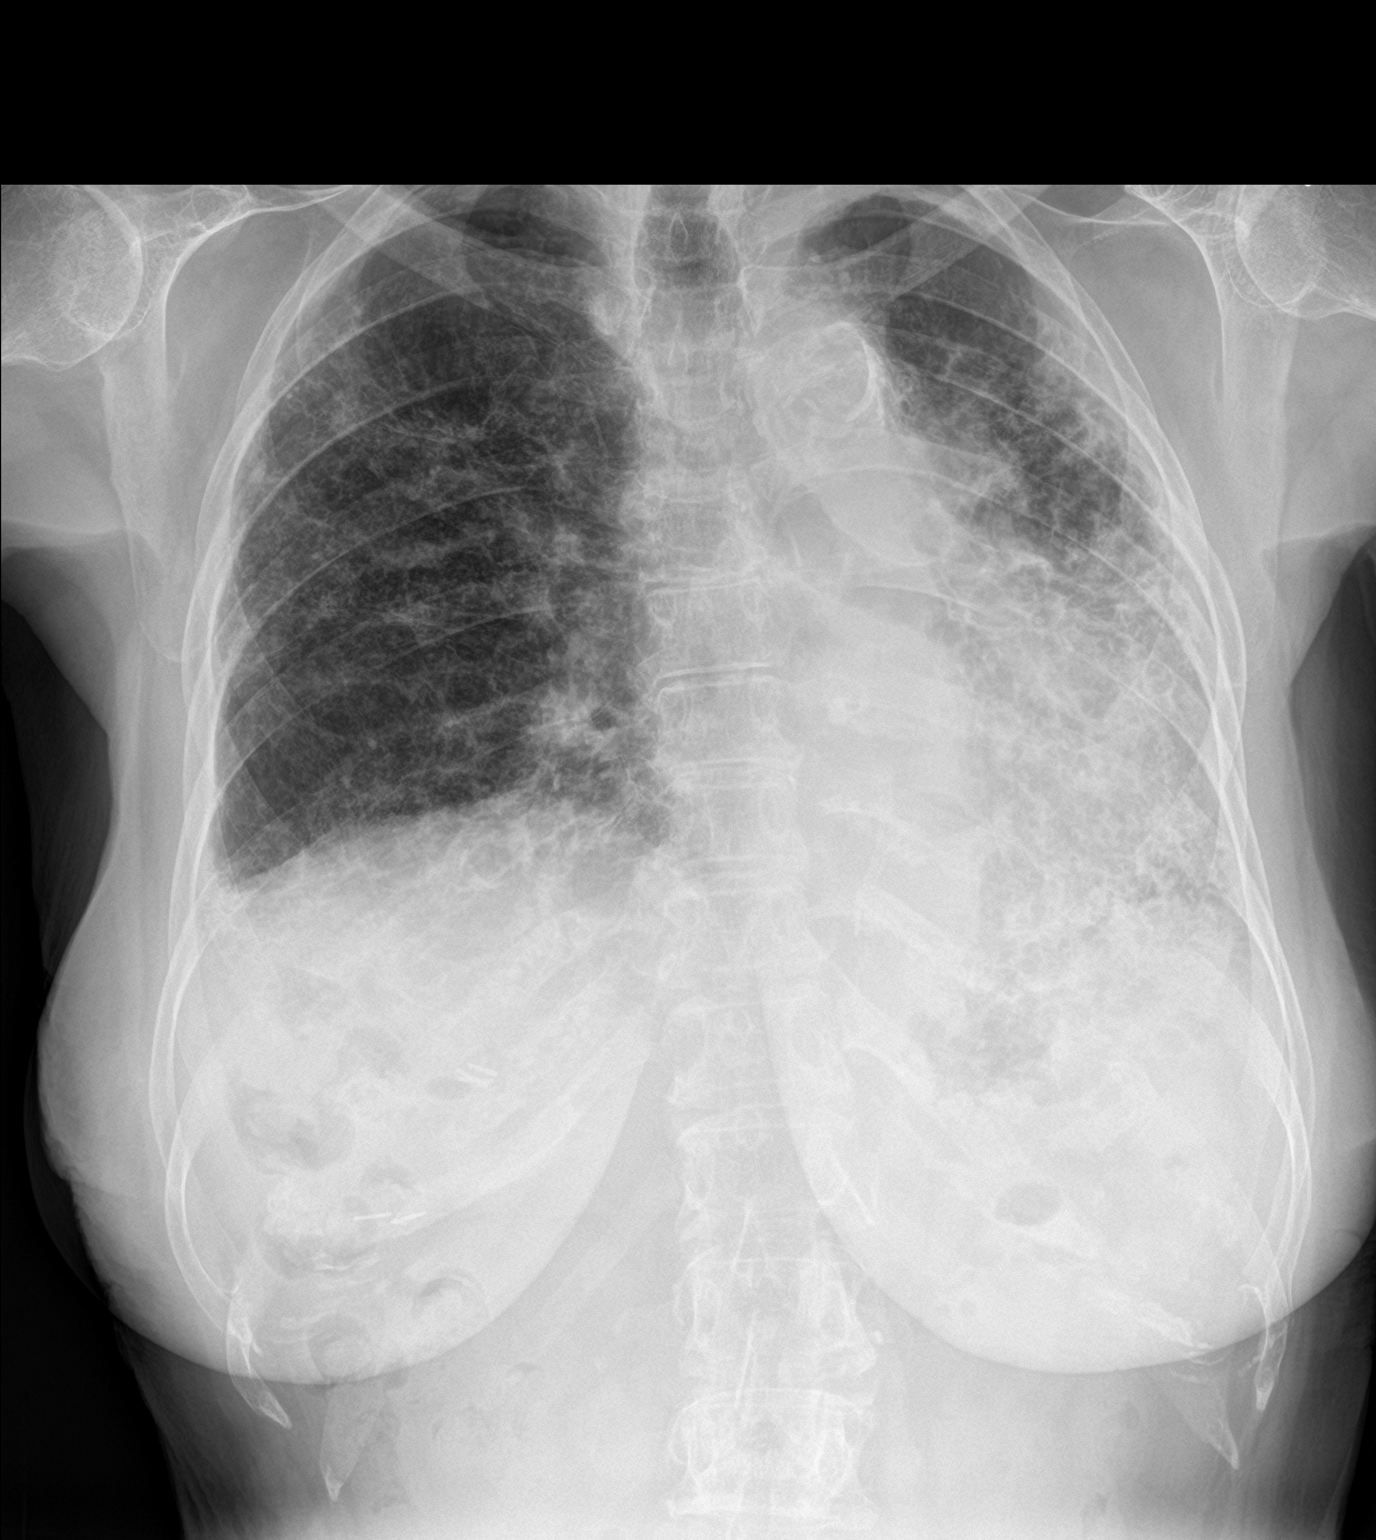

[chest lat]
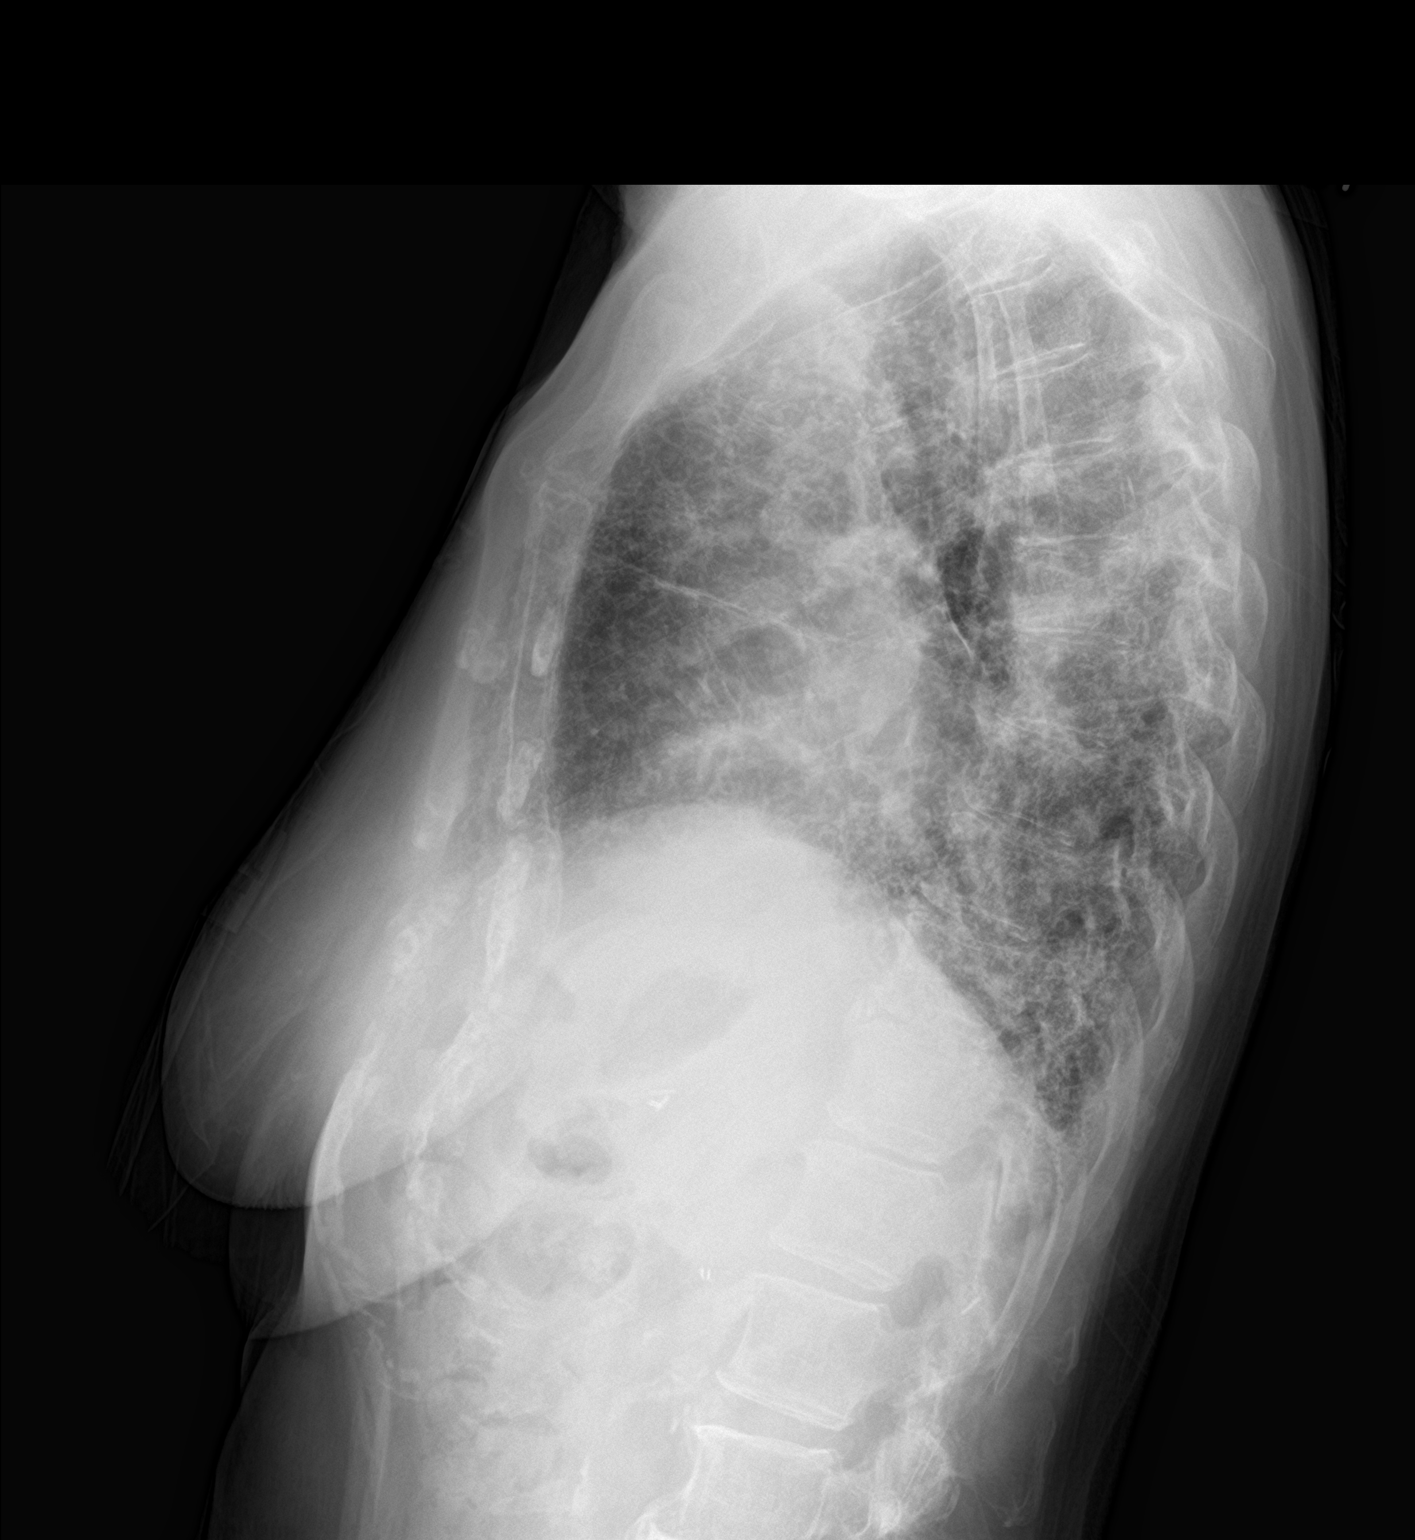

[2 of 2 positions shown; findings below may reference images not displayed]

FINDINGS: Lungs are hypoinflated with stable coarse interstitial changes
compatible with known pulmonary fibrosis. No definite acute airspace
process. Cardiomediastinal silhouette is within normal. There is
moderate calcified plaque over the aortic arch. There are
degenerative changes of the spine.
IMPRESSION: No active cardiopulmonary disease.

Hypoinflation with known chronic stable pulmonary fibrosis.

Aortic Atherosclerosis ([id number]).

## 2017-12-05 DIAGNOSIS — R05 Cough: Secondary | ICD-10-CM | POA: Diagnosis not present

## 2017-12-05 DIAGNOSIS — I10 Essential (primary) hypertension: Secondary | ICD-10-CM | POA: Diagnosis not present

## 2017-12-05 DIAGNOSIS — J8410 Pulmonary fibrosis, unspecified: Secondary | ICD-10-CM | POA: Diagnosis not present

## 2017-12-07 ENCOUNTER — Encounter: Payer: Self-pay | Admitting: Pulmonary Disease

## 2017-12-10 ENCOUNTER — Ambulatory Visit: Payer: Medicare HMO | Admitting: Pulmonary Disease

## 2017-12-30 ENCOUNTER — Encounter: Payer: Self-pay | Admitting: Pulmonary Disease

## 2018-01-02 DIAGNOSIS — R05 Cough: Secondary | ICD-10-CM | POA: Diagnosis not present

## 2018-01-02 DIAGNOSIS — J8410 Pulmonary fibrosis, unspecified: Secondary | ICD-10-CM | POA: Diagnosis not present

## 2018-01-02 DIAGNOSIS — I10 Essential (primary) hypertension: Secondary | ICD-10-CM | POA: Diagnosis not present

## 2018-01-06 ENCOUNTER — Encounter: Payer: Self-pay | Admitting: Acute Care

## 2018-01-06 ENCOUNTER — Ambulatory Visit: Payer: Medicare HMO | Admitting: Acute Care

## 2018-01-06 VITALS — BP 110/60 | HR 107 | Ht 65.0 in | Wt 102.2 lb

## 2018-01-06 DIAGNOSIS — J9611 Chronic respiratory failure with hypoxia: Secondary | ICD-10-CM | POA: Diagnosis not present

## 2018-01-06 DIAGNOSIS — J84112 Idiopathic pulmonary fibrosis: Secondary | ICD-10-CM

## 2018-01-06 NOTE — Assessment & Plan Note (Signed)
Compliant with Ofev, but worsening dyspnea Plan: We will do labs today to check your liver function on Ofev.>> please send results to Dr. Garner Nashaniels patient's primary care office. We will determine your oxygen needs today We will schedule a 2 D Echo to check your heart PFT's as were previously ordered. We will consider a 6 minute walk the next time you come in to see us. Continue Ofev 100 mg twice a day Continue oxygen at 5 L at rest and 6 L continuous flow with exertion. You have to use continuous flow oxygen Ask your PCP to place order for home physical therapy. We will send in a prescription for the new flow rate. Follow up with Dr. Kendrick FriesMcQuaid after  Echo Please contact office for sooner follow up if symptoms do not improve or worsen or seek emergency care

## 2018-01-06 NOTE — Progress Notes (Signed)
History of Present Illness [name] is a 78 y.o. female former smoker, quit 2001 with a 25 pack year history, with ILD, on OFEV, and chronic hypoxic respiratory failure.. She is followed by Dr. Kendrick Fries.   Synopsis: Former [facility] patient with fibrosis Episode of ARDS 2004, +exposure to macrodantin (but not on chronically) Started Ofev 2018   01/06/2018  Pt was last seen in the office by Dr. Kendrick Fries 11/06/2017. At that time she had had a recent hospitalization and was complaining of worsening dyspnea. Plan at that time was for CBC to rule out anemia, check a CXR and PFT's, work on increasing her exercise/activity ( in case deconditioning post hospitalization was a component of dyspnea) and continue her Ofev and oxygen. CXR at the time showed Left greater than right pulmonary fibrosis, not significantly changed from previous films.PFT's have not been completed ( Ordered 11/06/2017). She states she has been too sick to do them. She has sent emails stating she remains very short of breath and needs more oxygen than what her tank can provide.. She presents today for a new oxygen prescription and qualification for more than 5 L [place] she now has prescribed. She states she can only walk about 50 feet before she very short of breath.she has lost 40 pounds in the last 2 years. She finds she is very weak.She states she is compliant with her Ofev. Lat labs were 06/2017.  Consider scheduling a 6 minute walk>> evaluate progression of disease Consider a 2 D Echo>> evaluate for cardiac involvement Consider CT Chest/ CXR>> evaluate for progression of disease  Test Results: 11/06/2017 CBC Latest Ref Rng & Units 11/06/2017 05/24/2017 05/23/2017  WBC 4.0 - 10.5 K/uL 17.5(H) 15.6(H) 17.4(H)  Hemoglobin 12.0 - 15.0 g/dL 09.8 11.3(L) 11.3(L)  Hematocrit 36.0 - 46.0 % 37.7 33.6(L) 33.5(L)  Platelets 150.0 - 400.0 K/uL 431.0(H) 292 277   Labs: Autoimmune labwork 10/2014:  Negative  Chest imaging: Echo 10/2015>>  The  estimated ejection   fraction was in the range of 55% to 60%. Wall motion was normal;   there were no regional wall motion abnormalities. Doppler   parameters are consistent with abnormal left ventricular   relaxation (grade 1 diastolic dysfunction).   Pulmonary arteries: Systolic pressure was mildly to moderately   increased. PA peak pressure: 41 mm Hg (S).  CT chest 08/2014:  Volume loss left hemithorax, fibrotic changes L > R, subpleural reticulation with a few areas of honeycombing.  (progressive by CXR from 2009) Barium swallow 2016:  +esophageal dysmotility and +aspiration noted. >> referred to speech.  04/2015 Modified barium swallow: no clear aspiration 04/2015 CT chest (HRCT) > worsening pulmonary fibrosis changes in bases L>R consistent with UIP (Entrikin read)  PFT PFT"s 2009:  No obstruction, TLC 3.23 (70%), DLCO 9.4 (50%) PFT"s 2016:  No obstruction, TLC 2.47 (50%), DLCO 7.90 (34%) July 2017 lung function testing ratio 88%, FVC 1.36 L (50% predicted), total lung capacity 2.63 L (53% predicted), DLCO 5.21 (22% predicted). 11/10/2016 FVC 1.26 L 47% predicted, FEV1 1.0 L 50% predicted. Total lung capacity 2.25 L 45% predicted, DLCO 5. 5.58 24 percent predicted    CBC Latest Ref Rng & Units 11/06/2017 05/24/2017 05/23/2017  WBC 4.0 - 10.5 K/uL 17.5(H) 15.6(H) 17.4(H)  Hemoglobin 12.0 - 15.0 g/dL 11.9 11.3(L) 11.3(L)  Hematocrit 36.0 - 46.0 % 37.7 33.6(L) 33.5(L)  Platelets 150.0 - 400.0 K/uL 431.0(H) 292 277    BMP Latest Ref Rng & Units 05/24/2017 05/23/2017 05/22/2017  Glucose  65 - 99 mg/dL 87 76 91  BUN 6 - 20 mg/dL 9 8 6   Creatinine 0.44 - 1.00 mg/dL 8.65 7.84 6.96  Sodium 135 - 145 mmol/L 135 129(L) 128(L)  Potassium 3.5 - 5.1 mmol/L 4.2 5.0 4.4  Chloride 101 - 111 mmol/L 100(L) 95(L) 94(L)  CO2 22 - 32 mmol/L 28 27 26   Calcium 8.9 - 10.3 mg/dL 2.9(B) 2.8(U) 1.3(K)    BNP No results found for: BNP  ProBNP No results found for: PROBNP  PFT    Component Value  Date/Time   FEV1PRE 1.01 11/10/2016 1402   FEV1POST 1.06 11/10/2016 1402   FVCPRE 1.45 05/01/2016 1253   FVCPOST 1.22 11/10/2016 1402   TLC 2.25 11/10/2016 1402   DLCOUNC 5.58 11/10/2016 1402   PREFEV1FVCRT 80 11/10/2016 1402   PSTFEV1FVCRT 87 11/10/2016 1402    No results found.   Past medical hx Past Medical History:  Diagnosis Date  . Allergic rhinitis   . CAD (coronary artery disease)    HepaCoat stent to proximal RCA 2004 with acute MI  . Complication of anesthesia   . Essential hypertension   . GERD (gastroesophageal reflux disease)   . History of acute inferior wall MI    Associated with RV infarct and cardiogenic shock 2004   . Hyperlipidemia   . Idiopathic pulmonary fibrosis (HCC)    Followed by Dr. Marchelle Gearing  . PONV (postoperative nausea and vomiting)      Social History   Tobacco Use  . Smoking status: Former Smoker    Packs/day: 1.00    Years: 25.00    Pack years: 25.00    Types: Cigarettes    Last attempt to quit: 10/14/1999    Years since quitting: 18.2  . Smokeless tobacco: Never Used  Substance Use Topics  . Alcohol use: No    Alcohol/week: 0.0 oz  . Drug use: No    [name] reports that she quit smoking about 18 years ago. Her smoking use included cigarettes. She has a 25.00 pack-year smoking history. She has never used smokeless tobacco. She reports that she does not drink alcohol or use drugs.  Tobacco Cessation: Former smoker with a 25 pack year smoking history.  Past surgical hx, Family hx, Social hx all reviewed.  Current Outpatient Medications on File Prior to Visit  Medication Sig  . albuterol (PROVENTIL HFA;VENTOLIN HFA) 108 (90 Base) MCG/ACT inhaler Inhale 1-2 puffs into the lungs every 6 (six) hours as needed for wheezing or shortness of breath.  Marland Kitchen aspirin EC 81 MG tablet Take 162 mg by mouth daily.  . cefpodoxime (VANTIN) 100 MG tablet Take 1 tablet (100 mg total) by mouth 2 (two) times daily.  Marland Kitchen desmopressin (DDAVP) 0.2 MG tablet  Take 0.2 mg by mouth at bedtime.   Marland Kitchen HYDROcodone-acetaminophen (NORCO) 5-325 MG tablet Take 1 tablet by mouth every 6 (six) hours as needed for moderate pain.  . metoprolol succinate (TOPROL-XL) 50 MG 24 hr tablet Take 50 mg by mouth every evening. Take with or immediately following a meal.   . mirtazapine (REMERON) 30 MG tablet Take 30 mg by mouth daily with lunch.  Marland Kitchen MYRBETRIQ 50 MG TB24 tablet Take 50 mg by mouth daily.   . Nintedanib (OFEV) 100 MG CAPS Take 1 capsule (100 mg total) by mouth 2 (two) times daily.  Marland Kitchen omeprazole (PRILOSEC) 20 MG capsule Take 20 mg by mouth daily.  . ondansetron (ZOFRAN) 4 MG tablet take 1 tablet by mouth every 6 hours if  needed for nausea and vomiting  . promethazine (PHENERGAN) 12.5 MG tablet Take 1 tablet (12.5 mg total) by mouth every 6 (six) hours as needed for nausea or vomiting.  . simvastatin (ZOCOR) 40 MG tablet Take 40 mg by mouth every evening.   . traMADol (ULTRAM) 50 MG tablet Take 50 mg by mouth 3 (three) times daily. For cough by Dr. Garner Nash   No current facility-administered medications on file prior to visit.      Allergies  Allergen Reactions  . Pirfenidone Other (See Comments)    (Esbriet)-severe GI upset resulted in patient being admitted to hospital.  . Codeine Other (See Comments)    Syncope   . Penicillins Other (See Comments)    Unknown reaction  Has patient had a PCN reaction causing immediate rash, facial/tongue/throat swelling, SOB or lightheadedness with hypotension: Unknown Has patient had a PCN reaction causing severe rash involving mucus membranes or skin necrosis: Unknown Has patient had a PCN reaction that required hospitalization: Unknown Has patient had a PCN reaction occurring within the last 10 years: Unknown If all of the above answers are "NO", then may proceed with Cephalosporin use.  . Sulfonamide Derivatives Rash    Review Of Systems:  Constitutional:   +  weight loss, No night sweats,  Fevers, chills,+   fatigue, or  lassitude.  HEENT:   No headaches,  Difficulty swallowing,  Tooth/dental problems, or  Sore throat,                No sneezing, itching, ear ache, nasal congestion, post nasal drip,   CV:  No chest pain,  Orthopnea, PND, swelling in lower extremities, anasarca, dizziness, palpitations, syncope.   GI  No heartburn, indigestion, abdominal pain, nausea, vomiting, diarrhea, change in bowel habits, loss of appetite, bloody stools.   Resp: + shortness of breath with exertion or at rest.  No excess mucus, no productive cough,  No non-productive cough,  No coughing up of blood.  No change in color of mucus.  No wheezing.  No chest wall deformity  Skin: no rash or lesions.  GU: no dysuria, change in color of urine, no urgency or frequency.  No flank pain, no hematuria   MS:  No joint pain or swelling.  No decreased range of motion.  No back pain.  Psych:  No change in mood or affect. No depression or anxiety.  No memory loss.   Vital Signs BP 110/60 (BP Location: Left Arm, Cuff Size: Normal)   Pulse (!) 107   Ht 5\' 5"  (1.651 m)   Wt 102 lb 3.2 oz (46.4 kg)   LMP  (LMP Unknown)   SpO2 96%   BMI 17.01 kg/m    Physical Exam:  General- No distress,  A&Ox3, pleasant , frail elderly female ENT: No sinus tenderness, TM clear, pale nasal mucosa, no oral exudate,no post nasal drip, no LAN Cardiac: S1, S2, regular rate and rhythm, no murmur Chest: No wheeze/ rales/ dullness; no accessory muscle use, no nasal flaring, no sternal retractions Abd.: Soft Non-tender, non-distended, non-obese Ext: No clubbing cyanosis, edema Neuro:  Deconditioned at baseline Skin: No rashes, warm and dry, intact, no lesions Psych: normal mood and behavior   Assessment/Plan  IPF (idiopathic pulmonary fibrosis) (HCC) Compliant with Ofev, but worsening dyspnea Plan: We will do labs today to check your liver function on Ofev.>> please send results to Dr. Garner Nash patient's primary care office. We  will determine your oxygen needs today We will schedule a 2  D Echo to check your heart PFT's as were previously ordered. We will consider a 6 minute walk the next time you come in to see us. Continue Ofev 100 mg twice a day Continue oxygen at 5 L at rest and 6 L continuous flow with exertion. You have to use continuous flow oxygen Ask your PCP to place order for home physical therapy. We will send in a prescription for the new flow rate. Follow up with Dr. Kendrick FriesMcQuaid after  Echo Please contact office for sooner follow up if symptoms do not improve or worsen or seek emergency care    Chronic hypoxemic respiratory failure (HCC) Worsening dyspnea Now requires continuous flow oxygen Pulsed oxygen is no longer maintaining patient Deconditioning is an element Plan: We will do labs today to check your liver function on Ofev.>> please send results to Dr. Garner Nashaniels patient's primary care office. We will determine your oxygen needs today We will schedule a 2 D Echo to check your heart PFT's as were previously ordered. We will consider a 6 minute walk the next time you come in to see us. Continue Ofev 100 mg twice a day Continue oxygen at 5 L at rest and 6 L continuous flow with exertion. You have to use continuous flow oxygen No more pulsed oxygen Ask your PCP to place order for home physical therapy. We will send in a prescription to your DME  for the new flow rate.  Follow up with Dr. Kendrick FriesMcQuaid after  Echo Please contact office for sooner follow up if symptoms do not improve or worsen or seek emergency care      Bevelyn NgoSarah F Shenice Dolder, NP 01/06/2018  6:33 PM

## 2018-01-06 NOTE — Assessment & Plan Note (Signed)
Worsening dyspnea Now requires continuous flow oxygen Pulsed oxygen is no longer maintaining patient Deconditioning is an element Plan: We will do labs today to check your liver function on Ofev.>> please send results to Dr. Garner Nashaniels patient's primary care office. We will determine your oxygen needs today We will schedule a 2 D Echo to check your heart PFT's as were previously ordered. We will consider a 6 minute walk the next time you come in to see us. Continue Ofev 100 mg twice a day Continue oxygen at 5 L at rest and 6 L continuous flow with exertion. You have to use continuous flow oxygen No more pulsed oxygen Ask your PCP to place order for home physical therapy. We will send in a prescription to your DME  for the new flow rate.  Follow up with Dr. Kendrick FriesMcQuaid after  Echo Please contact office for sooner follow up if symptoms do not improve or worsen or seek emergency care

## 2018-01-06 NOTE — Patient Instructions (Addendum)
It is nice to see you today. We will do labs today to check your liver function on Ofev.>> please send results to Dr. Garner Nashaniels patient's primary care office. We will determine your oxygen needs today We will schedule a 2 D Echo to check your heart PFT's as were previously ordered. We will consider a 6 minute walk the next time you come in to see us. Continue Ofev 100 mg twice a day Continue oxygen at 5 L at rest and 6 L continuous flow with exertion. You have to use continuous flow oxygen Ask your PCP to place order for home physical therapy. We will send in a prescription for the new flow rate. Follow up with Dr. Kendrick FriesMcQuaid after  Echo Please contact office for sooner follow up if symptoms do not improve or worsen or seek emergency care

## 2018-01-07 ENCOUNTER — Telehealth: Payer: Self-pay | Admitting: Pulmonary Disease

## 2018-01-07 DIAGNOSIS — I10 Essential (primary) hypertension: Secondary | ICD-10-CM | POA: Diagnosis not present

## 2018-01-07 DIAGNOSIS — I2510 Atherosclerotic heart disease of native coronary artery without angina pectoris: Secondary | ICD-10-CM | POA: Diagnosis not present

## 2018-01-07 DIAGNOSIS — J84112 Idiopathic pulmonary fibrosis: Secondary | ICD-10-CM | POA: Diagnosis not present

## 2018-01-07 DIAGNOSIS — J9611 Chronic respiratory failure with hypoxia: Secondary | ICD-10-CM | POA: Diagnosis not present

## 2018-01-07 NOTE — Telephone Encounter (Signed)
Called and spoke with patients daughter, she is wanting to know the amount of liter flow patient is now on. Advised daughter that she is on 6 liters. Nothing further needed.

## 2018-01-07 NOTE — Telephone Encounter (Signed)
Called and spoke with [employer]. They advised me that the small portable tank was picked up due to the patients liter flow now being higher. They dropped off E tanks for the patient. Daughter is aware. Nothing further needed.

## 2018-01-08 ENCOUNTER — Ambulatory Visit (HOSPITAL_COMMUNITY)
Admission: RE | Admit: 2018-01-08 | Discharge: 2018-01-08 | Disposition: A | Discharge status: Home / Self Care | Payer: Medicare HMO | Source: Ambulatory Visit | Attending: Acute Care | Admitting: Acute Care

## 2018-01-08 DIAGNOSIS — I10 Essential (primary) hypertension: Secondary | ICD-10-CM | POA: Insufficient documentation

## 2018-01-08 DIAGNOSIS — E785 Hyperlipidemia, unspecified: Secondary | ICD-10-CM | POA: Diagnosis not present

## 2018-01-08 DIAGNOSIS — I2510 Atherosclerotic heart disease of native coronary artery without angina pectoris: Secondary | ICD-10-CM | POA: Diagnosis not present

## 2018-01-08 DIAGNOSIS — J84112 Idiopathic pulmonary fibrosis: Secondary | ICD-10-CM | POA: Diagnosis not present

## 2018-01-08 NOTE — Progress Notes (Signed)
*  PRELIMINARY RESULTS* Echocardiogram 2D Echocardiogram has been performed.  Stacey DrainWhite, Kru Allman J 01/08/2018, 1:44 PM

## 2018-01-13 ENCOUNTER — Other Ambulatory Visit: Payer: Self-pay

## 2018-01-13 ENCOUNTER — Telehealth: Payer: Self-pay | Admitting: Pulmonary Disease

## 2018-01-13 DIAGNOSIS — R0609 Other forms of dyspnea: Principal | ICD-10-CM

## 2018-01-13 DIAGNOSIS — I270 Primary pulmonary hypertension: Secondary | ICD-10-CM

## 2018-01-14 NOTE — Telephone Encounter (Signed)
I spoke with pt today and explained to her what was needed. I gave her the appointments and advised her of what tests she would be doing. Pt understood and nothing further is needed.

## 2018-01-19 ENCOUNTER — Telehealth: Payer: Self-pay | Admitting: Pulmonary Disease

## 2018-01-19 NOTE — Telephone Encounter (Signed)
Per pt's last OV with SG:  Continue oxygen at 5 L at rest and 6 L continuous flow with exertion. You have to use continuous flow oxygen  Called pt's daughter Kendal HymenBonnie and stated this information to her that pt needs to have continuous O2 and that was the reason the DME took away her POC and gave her tanks.  Kendal HymenBonnie expressed understanding and would try to explain this information to pt.  Nothing further needed at this time.

## 2018-01-22 ENCOUNTER — Ambulatory Visit (HOSPITAL_COMMUNITY)
Admission: RE | Admit: 2018-01-22 | Discharge: 2018-01-22 | Disposition: A | Discharge status: Home / Self Care | Payer: Medicare HMO | Source: Ambulatory Visit | Attending: Pulmonary Disease | Admitting: Pulmonary Disease

## 2018-01-22 ENCOUNTER — Ambulatory Visit (HOSPITAL_COMMUNITY): Payer: Medicare HMO

## 2018-01-22 ENCOUNTER — Ambulatory Visit (HOSPITAL_COMMUNITY): Admission: RE | Admit: 2018-01-22 | Payer: Medicare HMO | Source: Ambulatory Visit

## 2018-01-22 ENCOUNTER — Encounter (HOSPITAL_COMMUNITY)
Admission: RE | Admit: 2018-01-22 | Discharge: 2018-01-22 | Disposition: A | Discharge status: Home / Self Care | Payer: Medicare HMO | Source: Ambulatory Visit | Attending: Pulmonary Disease | Admitting: Pulmonary Disease

## 2018-01-22 ENCOUNTER — Other Ambulatory Visit: Payer: Self-pay | Admitting: Pulmonary Disease

## 2018-01-22 DIAGNOSIS — I700 Atherosclerosis of aorta: Secondary | ICD-10-CM | POA: Diagnosis not present

## 2018-01-22 DIAGNOSIS — K449 Diaphragmatic hernia without obstruction or gangrene: Secondary | ICD-10-CM | POA: Diagnosis not present

## 2018-01-22 DIAGNOSIS — J849 Interstitial pulmonary disease, unspecified: Secondary | ICD-10-CM | POA: Diagnosis not present

## 2018-01-22 DIAGNOSIS — J432 Centrilobular emphysema: Secondary | ICD-10-CM | POA: Insufficient documentation

## 2018-01-22 DIAGNOSIS — J8410 Pulmonary fibrosis, unspecified: Secondary | ICD-10-CM | POA: Diagnosis not present

## 2018-01-22 DIAGNOSIS — I270 Primary pulmonary hypertension: Secondary | ICD-10-CM

## 2018-01-22 DIAGNOSIS — N200 Calculus of kidney: Secondary | ICD-10-CM | POA: Diagnosis not present

## 2018-01-22 DIAGNOSIS — I10 Essential (primary) hypertension: Secondary | ICD-10-CM | POA: Diagnosis not present

## 2018-01-22 DIAGNOSIS — R0609 Other forms of dyspnea: Secondary | ICD-10-CM

## 2018-01-22 DIAGNOSIS — I2510 Atherosclerotic heart disease of native coronary artery without angina pectoris: Secondary | ICD-10-CM | POA: Diagnosis not present

## 2018-01-22 DIAGNOSIS — R0602 Shortness of breath: Secondary | ICD-10-CM | POA: Insufficient documentation

## 2018-01-22 MED ORDER — TECHNETIUM TO 99M ALBUMIN AGGREGATED
4.2700 | Freq: Once | INTRAVENOUS | Status: AC | PRN
  Administered 2018-01-22: 4.27 via INTRAVENOUS

## 2018-01-22 MED ORDER — TECHNETIUM TC 99M DIETHYLENETRIAME-PENTAACETIC ACID
32.4000 | Freq: Once | INTRAVENOUS | Status: AC | PRN
  Administered 2018-01-22: 32.4 via RESPIRATORY_TRACT

## 2018-01-29 ENCOUNTER — Encounter: Payer: Self-pay | Admitting: Pulmonary Disease

## 2018-01-29 NOTE — Telephone Encounter (Signed)
BQ please advise if okay to order POC. Pt currently on 5L at rest 6L with exertion.

## 2018-02-07 DIAGNOSIS — I2510 Atherosclerotic heart disease of native coronary artery without angina pectoris: Secondary | ICD-10-CM | POA: Diagnosis not present

## 2018-02-07 DIAGNOSIS — I10 Essential (primary) hypertension: Secondary | ICD-10-CM | POA: Diagnosis not present

## 2018-02-07 DIAGNOSIS — J9611 Chronic respiratory failure with hypoxia: Secondary | ICD-10-CM | POA: Diagnosis not present

## 2018-02-07 DIAGNOSIS — J84112 Idiopathic pulmonary fibrosis: Secondary | ICD-10-CM | POA: Diagnosis not present

## 2018-02-09 ENCOUNTER — Telehealth: Payer: Self-pay | Admitting: Pulmonary Disease

## 2018-02-09 NOTE — Telephone Encounter (Addendum)
ATC Bonnie, no answer. Left message for her to call back.

## 2018-02-10 NOTE — Telephone Encounter (Signed)
Called and spoke to pt's daughter, Kendal Hymen.  Kendal Hymen is requesting POC for pt.  Pt is currently on 5L at rest and 6L on exertion.  Per SG last OV note- pt was to continue cont flow.   BQ please advise on POC. Thank

## 2018-02-10 NOTE — Telephone Encounter (Signed)
Will close this message since it a duplicate message. See phone note from 4/19 that has been sent to BQ.

## 2018-02-10 NOTE — Telephone Encounter (Signed)
Patient's daughter, Kendal Hymen, is returning call about ordering POC, see message from 02/09/18 that was closed. CB is [phone].

## 2018-02-23 ENCOUNTER — Ambulatory Visit: Payer: Medicare HMO | Admitting: Pulmonary Disease

## 2018-02-23 ENCOUNTER — Ambulatory Visit (INDEPENDENT_AMBULATORY_CARE_PROVIDER_SITE_OTHER): Payer: Medicare HMO | Admitting: Pulmonary Disease

## 2018-02-23 ENCOUNTER — Encounter: Payer: Self-pay | Admitting: Pulmonary Disease

## 2018-02-23 VITALS — BP 108/70 | HR 82 | Ht 63.0 in | Wt 99.0 lb

## 2018-02-23 DIAGNOSIS — J84112 Idiopathic pulmonary fibrosis: Secondary | ICD-10-CM

## 2018-02-23 NOTE — Patient Instructions (Signed)
Idiopathic pulmonary fibrosis: As we discussed today, this is progressed to a very severe level.  I recommend hospice.  I would like for you all to discuss this is a family and then let me know if you are interested in hospice referral.  Otherwise I will plan on seeing you back in 8 to 10 weeks.  Chronic respiratory failure with hypoxemia: Continue 5 L at rest, 6 L with exertion

## 2018-02-23 NOTE — Progress Notes (Signed)
Subjective:    Patient ID: [name], female    DOB: July 15, 1941, 77 y.o.   MRN: [id number]  Synopsis: Former [facility] patient with fibrosis Episode of ARDS 2004, +exposure to macrodantin (but not on chronically) Started Ofev 2018   HPI Chief Complaint  Patient presents with  . Follow-up    unable to complete PFT    [name] says that she has not been able to use her portable oxygen due to her high oxygen needs.  She says she feels limited by her breathing.  She can now barely make it through the house without stopping because of shortness of breath.  She never leaves the house.  No recent cough.  Still taking her Ofev.  She is frustrated by the fact that the only time she relieves the house is for doctor's visits.  She is unsure if she needs to have more testing.  She says that she has been thinking about her own mortality recently.  Past Medical History:  Diagnosis Date  . Allergic rhinitis   . CAD (coronary artery disease)    HepaCoat stent to proximal RCA 2004 with acute MI  . Complication of anesthesia   . Essential hypertension   . GERD (gastroesophageal reflux disease)   . History of acute inferior wall MI    Associated with RV infarct and cardiogenic shock 2004   . Hyperlipidemia   . Idiopathic pulmonary fibrosis (HCC)    Followed by Dr. Marchelle Gearing  . PONV (postoperative nausea and vomiting)       Review of Systems  Constitutional: Negative for diaphoresis, fatigue and fever.  HENT: Negative for postnasal drip, rhinorrhea and sinus pressure.   Respiratory: Positive for cough and shortness of breath. Negative for wheezing.   Cardiovascular: Negative for chest pain, palpitations and leg swelling.       Objective:   Physical Exam Vitals:   02/23/18 1536  BP: 108/70  Pulse: 82  SpO2: (!) 89%  Weight: 99 lb (44.9 kg)  Height:  (1.6 m)   5L   Gen: chronically ill appearing HENT: OP clear, TM's clear, neck supple PULM: Crackles bases B, normal  percussion CV: RRR, no mgr, trace edema GI: BS+, soft, nontender Derm: no cyanosis or rash Psyche: normal mood and affect    Labs: Autoimmune labwork 10/2014:  Negative  Chest imaging: CT chest 08/2014:  Volume loss left hemithorax, fibrotic changes L > R, subpleural reticulation with a few areas of honeycombing.  (progressive by CXR from 2009) Barium swallow 2016:  +esophageal dysmotility and +aspiration noted. >> referred to speech.  04/2015 Modified barium swallow: no clear aspiration 04/2015 CT chest (HRCT) > worsening pulmonary fibrosis changes in bases L>R consistent with UIP ([place] read)  PFT PFT"s 2009:  No obstruction, TLC 3.23 (70%), DLCO 9.4 (50%) PFT"s 2016:  No obstruction, TLC 2.47 (50%), DLCO 7.90 (34%) July 2017 lung function testing ratio 88%, FVC 1.36 L (50% predicted), total lung capacity 2.63 L (53% predicted), DLCO 5.21 (22% predicted). 11/10/2016 FVC 1.26 L 47% predicted, FEV1 1.0 L 50% predicted. Total lung capacity 2.25 L 45% predicted, DLCO 5. 5.58 24 percent predicted  Records rewiewed in card everywhere, no recent labs available for review      Assessment & Plan:   IPF (idiopathic pulmonary fibrosis) (HCC)   Discussion: [name] has done very poorly over the last several months.  Specifically her oxygen needs have increased and her shortness of breath has become debilitating.  We spent an  extensive amount of time talking about the fact that she has a disease that man cannot cure.  The lung function test today could not be performed because of the severity of her disease but the recent CT scan showed significant progression of her ILD.  She is at a point now where I think she would benefit from hospice.  This came as a bit of a surprise to them but they are willing to consider this.  She tells me that her CODE STATUS should be DNR so I will write an order to reflect that.  They are going to go home and talk about this is a family and then let us know if  they are willing to have a hospice referral.  I will plan on a follow-up visit in 8 to 10 weeks, but I told him they could cancel that if that is too burdensome.  For now she will continue Ofev but if they decide to go on hospice then we can discontinue that.  Idiopathic pulmonary fibrosis: As we discussed today, this is progressed to a very severe level.  I recommend hospice.  I would like for you all to discuss this is a family and then let me know if you are interested in hospice referral.  Otherwise I will plan on seeing you back in 8 to 10 weeks.  Chronic respiratory failure with hypoxemia: Continue 5 L at rest, 6 L with exertion  Follow up 8-10 weeks    Current Outpatient Medications:  .  albuterol (PROVENTIL HFA;VENTOLIN HFA) 108 (90 Base) MCG/ACT inhaler, Inhale 1-2 puffs into the lungs every 6 (six) hours as needed for wheezing or shortness of breath., Disp: 1 Inhaler, Rfl: 3 .  aspirin EC 81 MG tablet, Take 162 mg by mouth daily., Disp: , Rfl:  .  cefpodoxime (VANTIN) 100 MG tablet, Take 1 tablet (100 mg total) by mouth 2 (two) times daily., Disp: 8 tablet, Rfl: 0 .  desmopressin (DDAVP) 0.2 MG tablet, Take 0.2 mg by mouth at bedtime. , Disp: , Rfl: 0 .  HYDROcodone-acetaminophen (NORCO) 5-325 MG tablet, Take 1 tablet by mouth every 6 (six) hours as needed for moderate pain., Disp: 30 tablet, Rfl: 0 .  metoprolol succinate (TOPROL-XL) 50 MG 24 hr tablet, Take 50 mg by mouth every evening. Take with or immediately following a meal. , Disp: , Rfl:  .  mirtazapine (REMERON) 30 MG tablet, Take 30 mg by mouth daily with lunch., Disp: , Rfl:  .  MYRBETRIQ 50 MG TB24 tablet, Take 50 mg by mouth daily. , Disp: , Rfl: 1 .  Nintedanib (OFEV) 100 MG CAPS, Take 1 capsule (100 mg total) by mouth 2 (two) times daily., Disp: 60 capsule, Rfl: 11 .  omeprazole (PRILOSEC) 20 MG capsule, Take 20 mg by mouth daily., Disp: , Rfl:  .  ondansetron (ZOFRAN) 4 MG tablet, take 1 tablet by mouth every 6 hours  if needed for nausea and vomiting, Disp: 45 tablet, Rfl: 2 .  promethazine (PHENERGAN) 12.5 MG tablet, Take 1 tablet (12.5 mg total) by mouth every 6 (six) hours as needed for nausea or vomiting., Disp: 30 tablet, Rfl: 0 .  simvastatin (ZOCOR) 40 MG tablet, Take 40 mg by mouth every evening. , Disp: , Rfl:  .  traMADol (ULTRAM) 50 MG tablet, Take 50 mg by mouth 3 (three) times daily. For cough by Dr. Garner Nash, Disp: , Rfl: 0

## 2018-02-23 NOTE — Progress Notes (Signed)
Patient unable to obtain PFT due to o2 not going above 87%

## 2018-03-01 ENCOUNTER — Encounter: Payer: Self-pay | Admitting: Pulmonary Disease

## 2018-03-01 NOTE — Telephone Encounter (Signed)
Received a message through mychart from pt.  Pt is wanting to know if she needs to continue taking her OFEV.  Dr. Kendrick Fries, please advise on this for pt. Thanks!

## 2018-03-09 DIAGNOSIS — I2510 Atherosclerotic heart disease of native coronary artery without angina pectoris: Secondary | ICD-10-CM | POA: Diagnosis not present

## 2018-03-09 DIAGNOSIS — J84112 Idiopathic pulmonary fibrosis: Secondary | ICD-10-CM | POA: Diagnosis not present

## 2018-03-09 DIAGNOSIS — I10 Essential (primary) hypertension: Secondary | ICD-10-CM | POA: Diagnosis not present

## 2018-03-09 DIAGNOSIS — J9611 Chronic respiratory failure with hypoxia: Secondary | ICD-10-CM | POA: Diagnosis not present

## 2018-04-12 DEATH — deceased

## 2019-01-29 IMAGING — US US RENAL
1 series · 13 of 25 positions shown · non-contrast
Comparison: 05/23/2017 images obtained during stent placement.
05/21/2017 CT.

CLINICAL DATA: 75-year-old female with history of right renal
calculi post cystoscopy 06/17/2017.

EXAM:
RENAL / URINARY TRACT ULTRASOUND COMPLETE

[Series 1: us renal · 0.23mm/px · 13 of 60 slices shown]
[im 1/60]
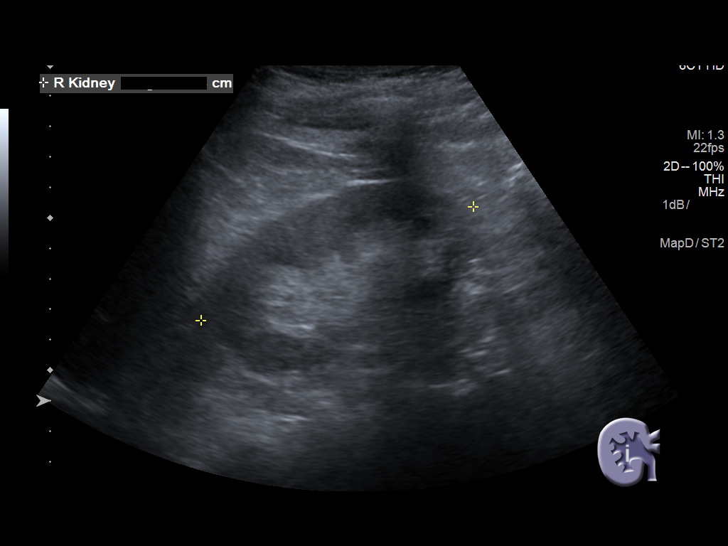
[im 5/60]
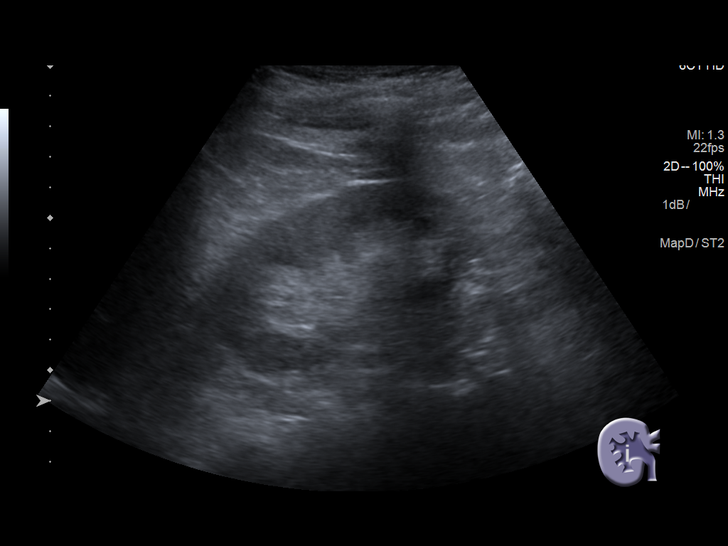
[im 10/60]
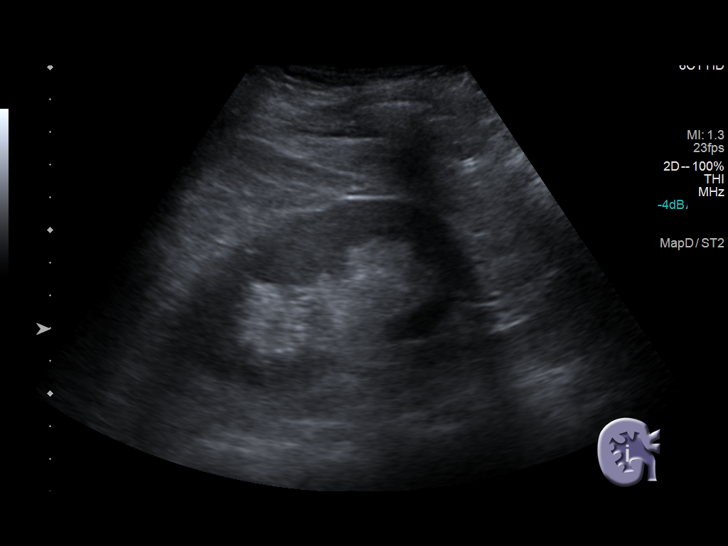
[im 15/60]
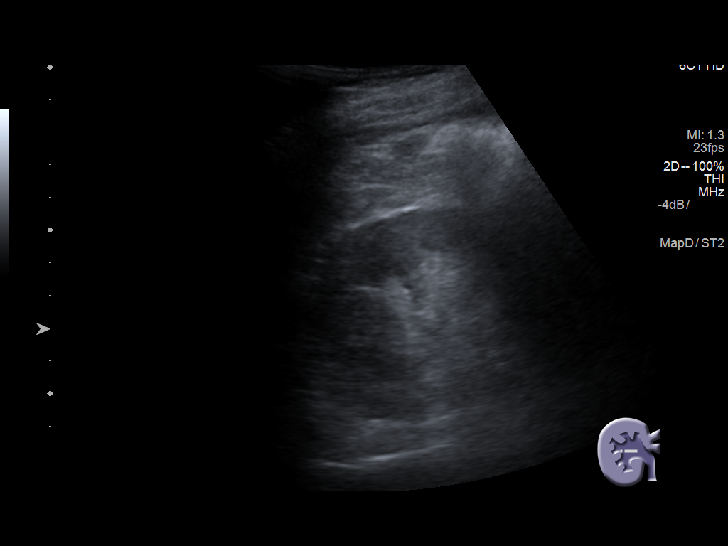
[im 20/60]
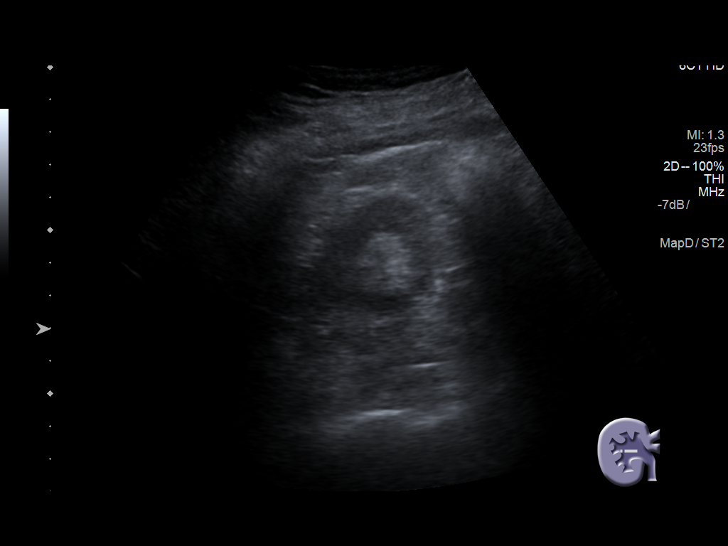
[im 25/60]
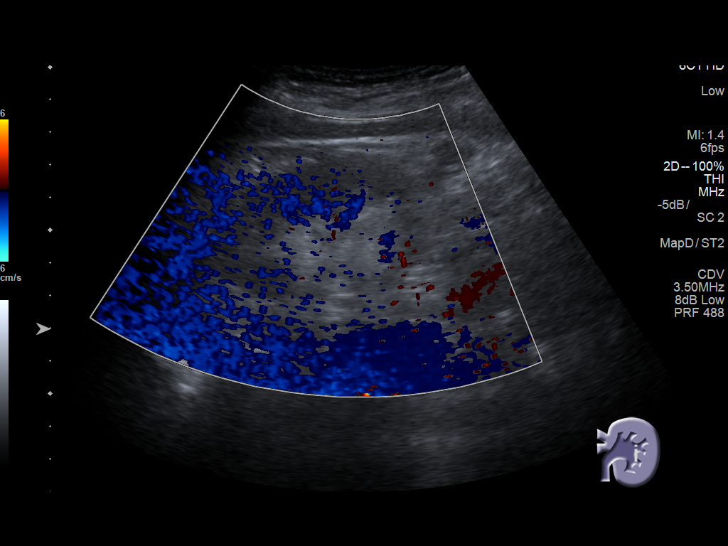
[im 30/60]
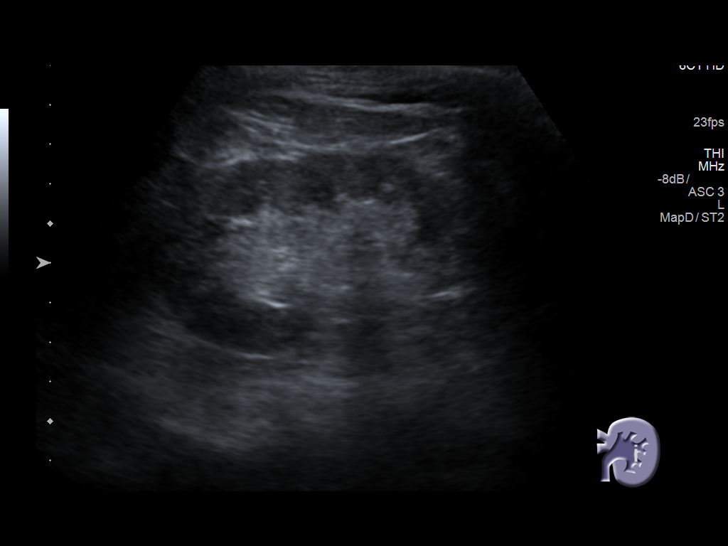
[im 35/60]
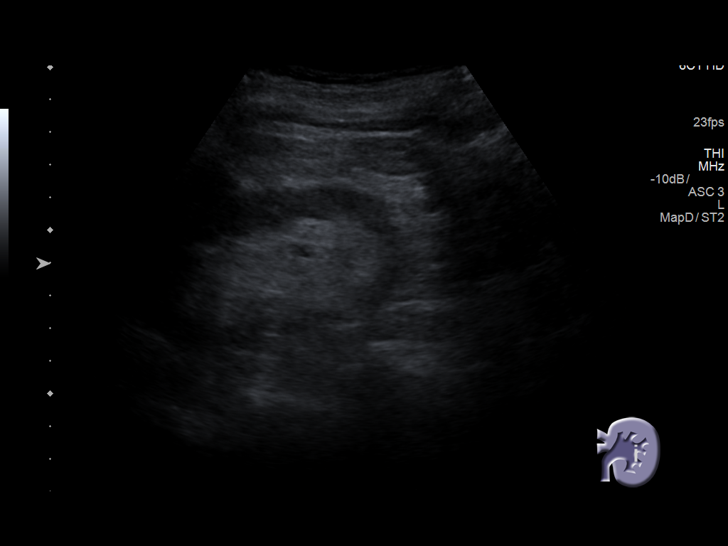
[im 40/60]
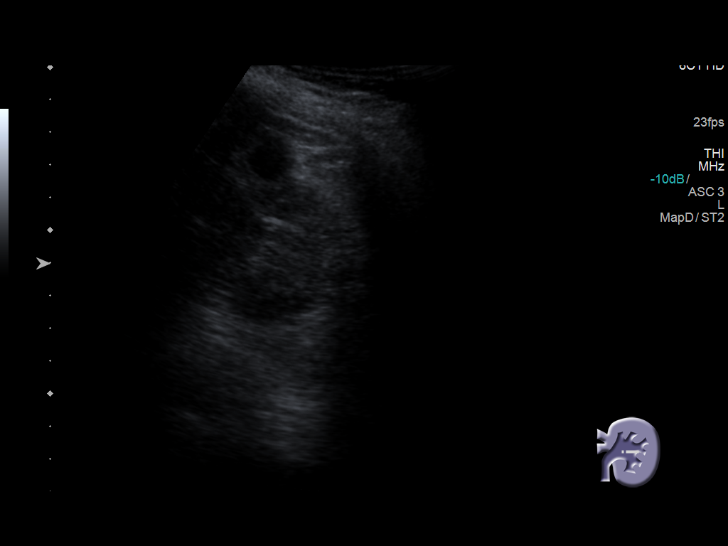
[im 45/60]
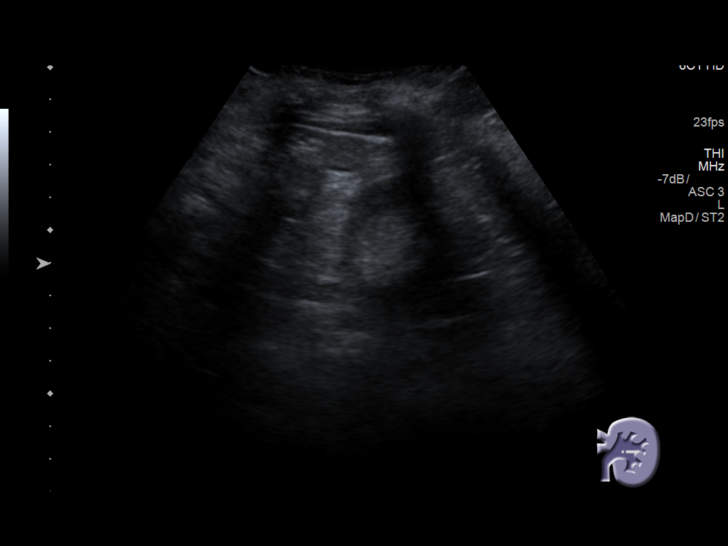
[im 50/60]
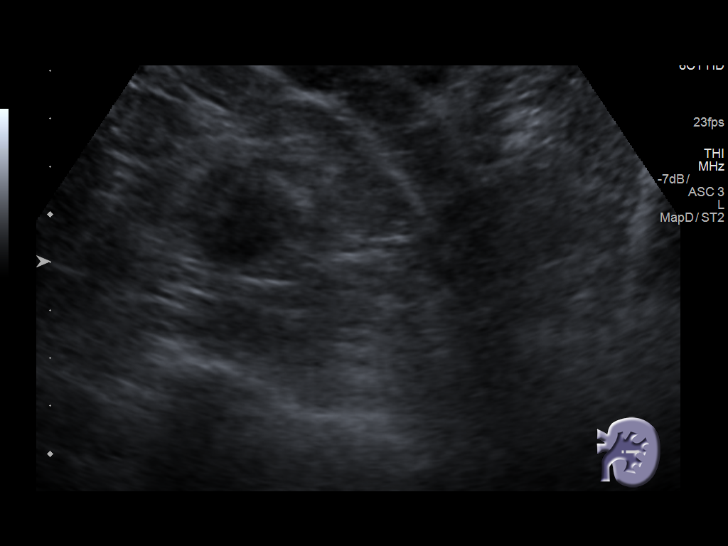
[im 55/60]
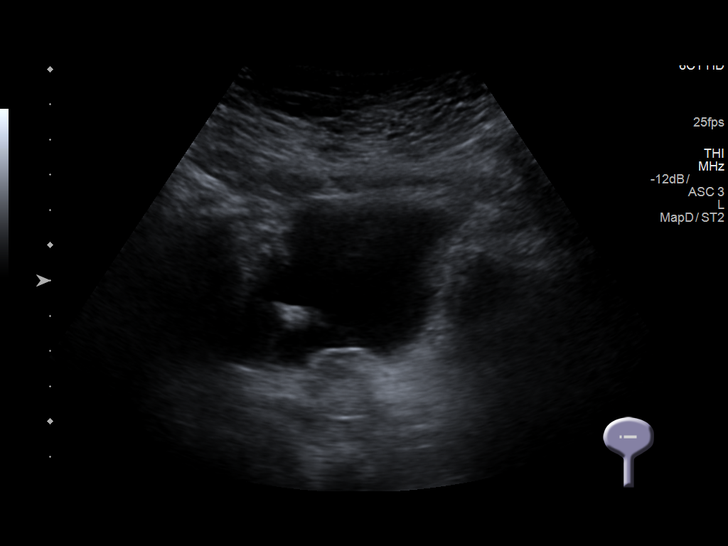
[im 60/60]
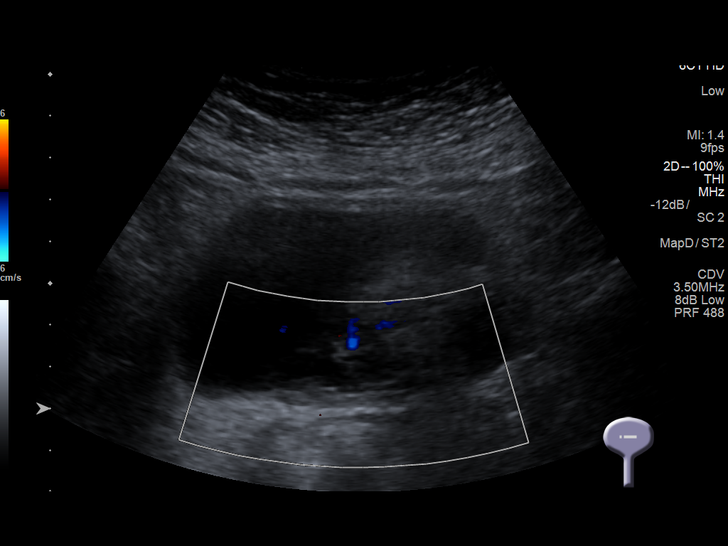

[13 of 25 positions shown; findings below may reference images not displayed]

FINDINGS: Right Kidney:

Length: 9.6 cm. Echogenicity within normal limits. No mass or
hydronephrosis visualized.

Left Kidney:

Length: 9.7 cm. No hydronephrosis. 1.7 x 1.7 x 1.1 cm cyst upper
pole region.

Bladder:

Within the right aspect of the bladder is a 1.7 x 1.3 x 1 cm
echogenic structure of indeterminate etiology. Bilateral ureteral
jets are noted.
IMPRESSION: No hydronephrosis on either side.

Within the right aspect of the bladder is a 1.7 x 1.3 x 1 cm
echogenic structure of indeterminate etiology. This may represent a
bladder calculus although does not shadow and appears larger than
previously noted right-sided renal calculi. Patient had stent placed
been removed recently. It is possible this represents residua of
such (edema/ hemorrhage). Retained portion of stent felt to be less
likely consideration given configuration. Primary tumor less likely
consideration as no discrete mass seen on prior CT (although
performed without delayed contrast filled images).

## 2019-05-03 IMAGING — US US RENAL
1 series · 14 of 25 positions shown · non-contrast
Comparison: 07/20/2017

CLINICAL DATA: Follow-up of renal calculi.

EXAM:
RENAL / URINARY TRACT ULTRASOUND COMPLETE

[Series 1: us renal · 0.25mm/px · 14 of 50 slices shown]
[im 1/50]
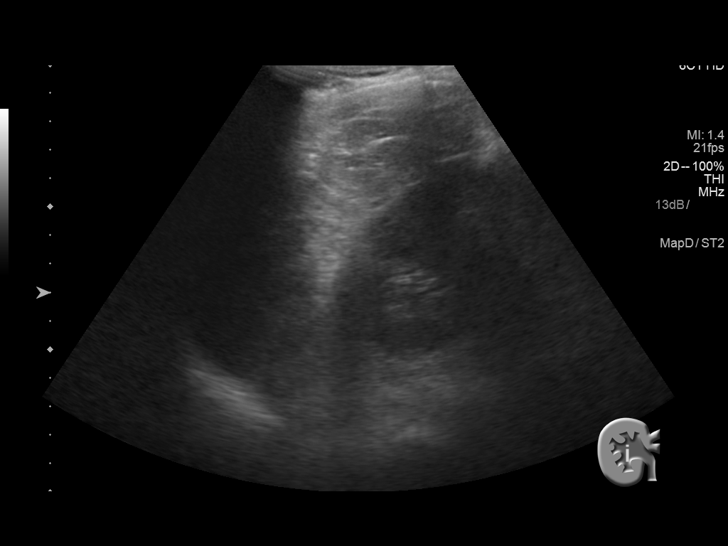
[im 5/50]
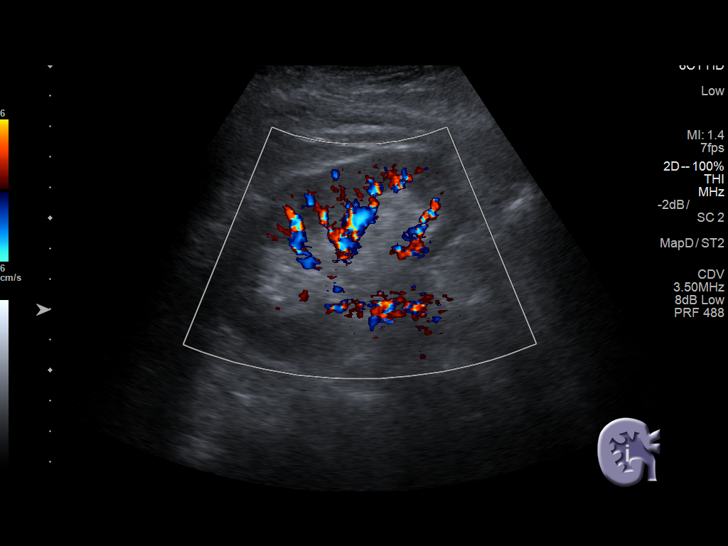
[im 9/50]
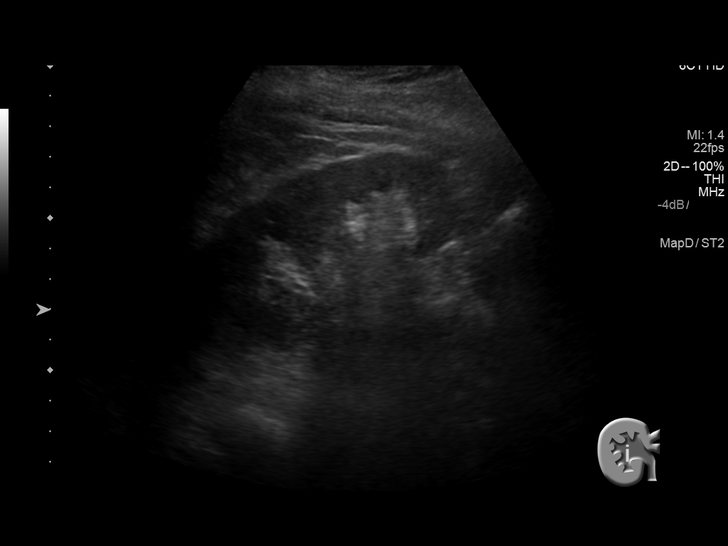
[im 13/50]
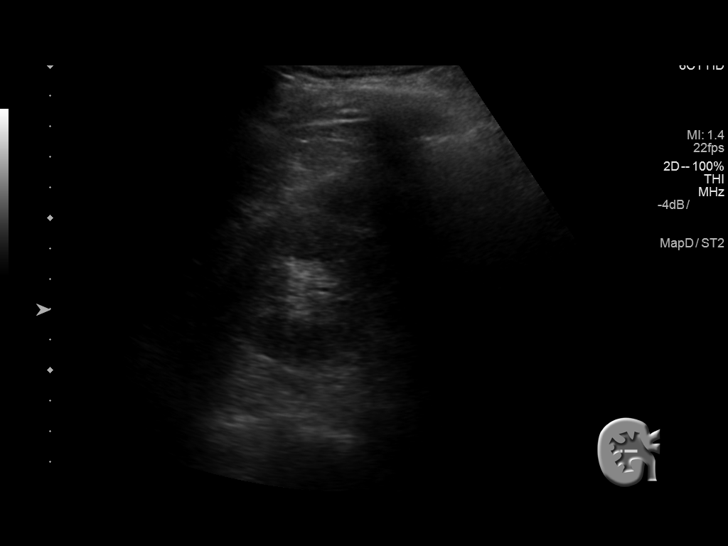
[im 17/50]
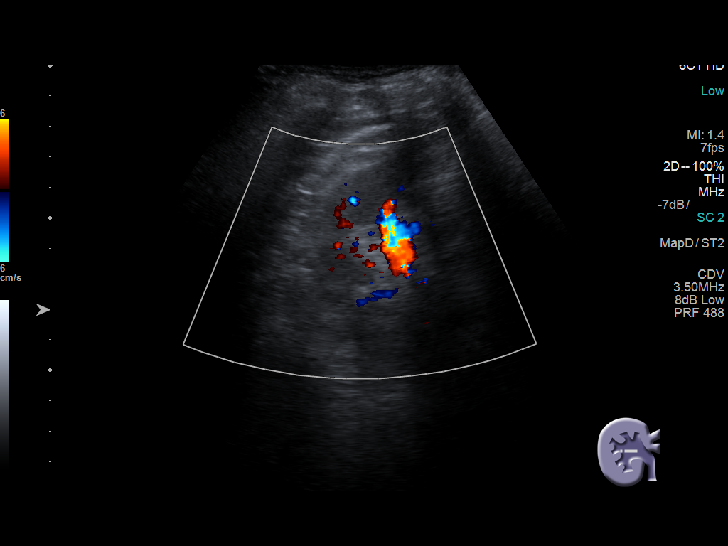
[im 19/50]
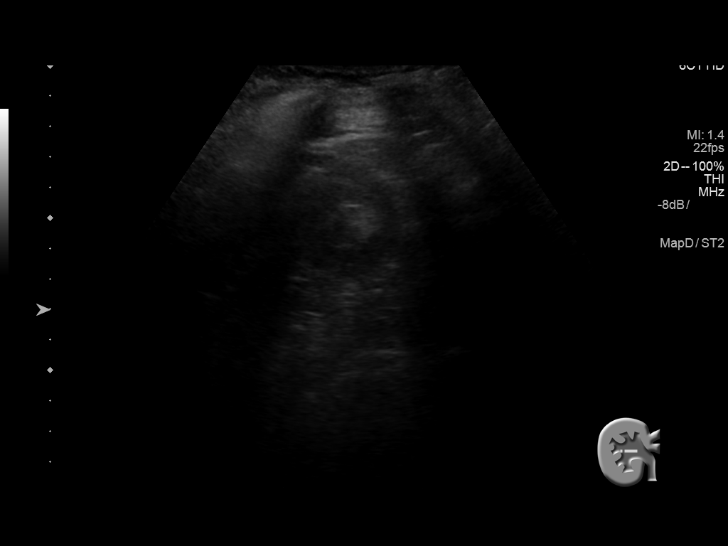
[im 23/50]
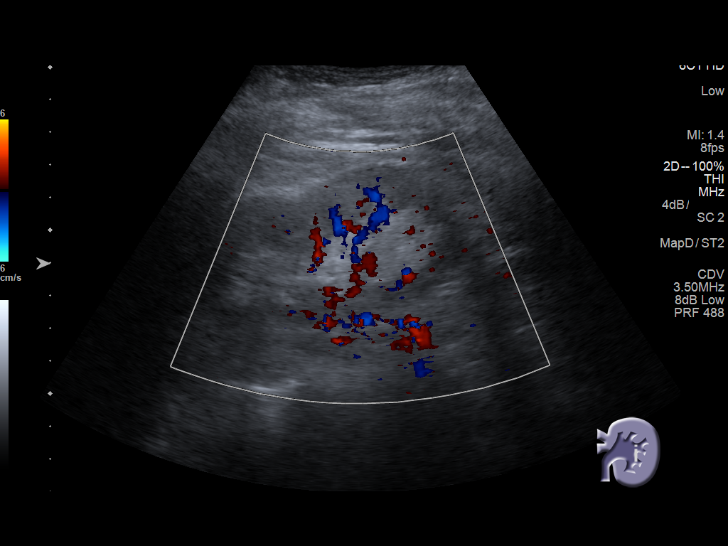
[im 27/50]
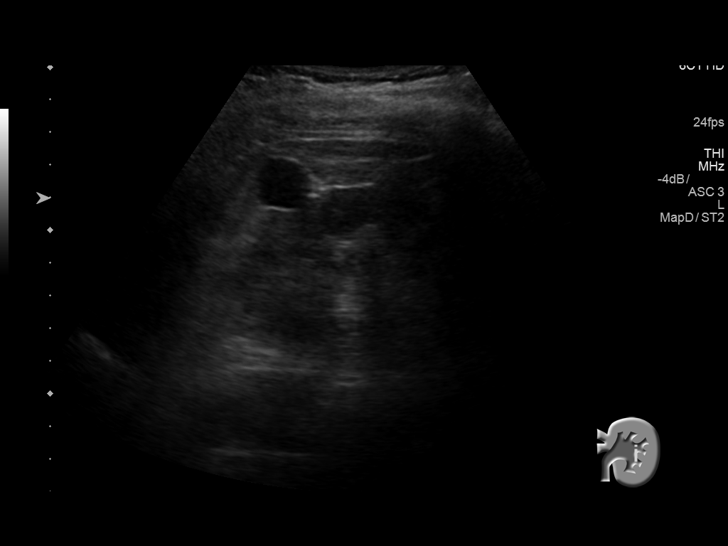
[im 31/50]
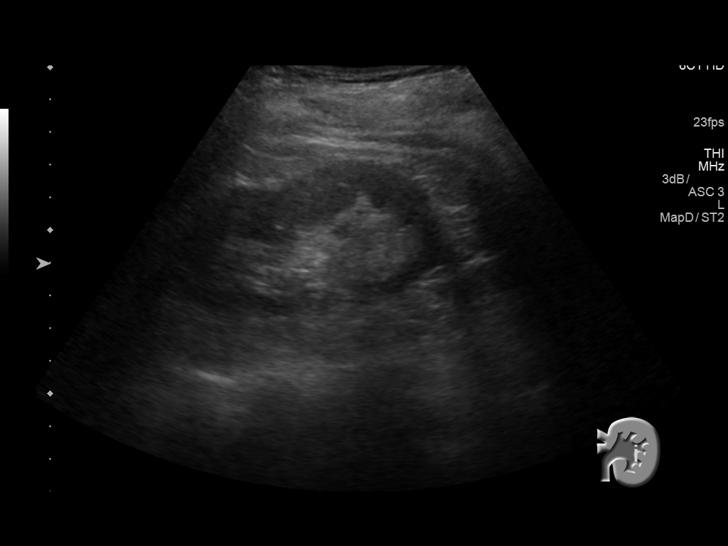
[im 33/50]
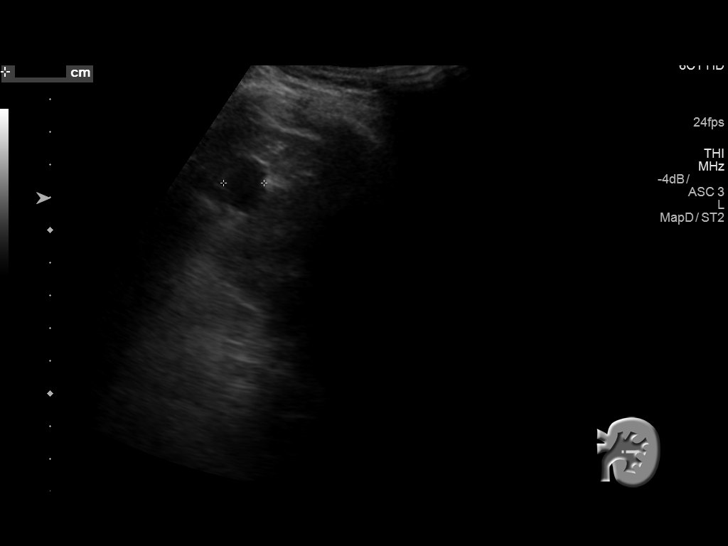
[im 37/50]
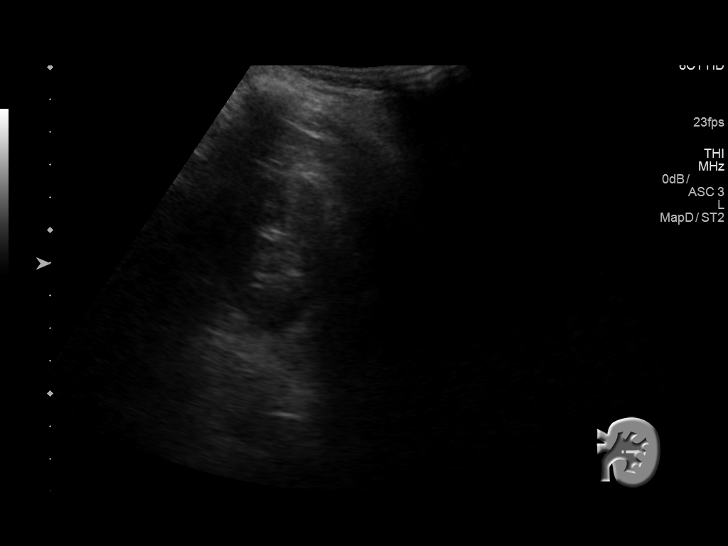
[im 41/50]
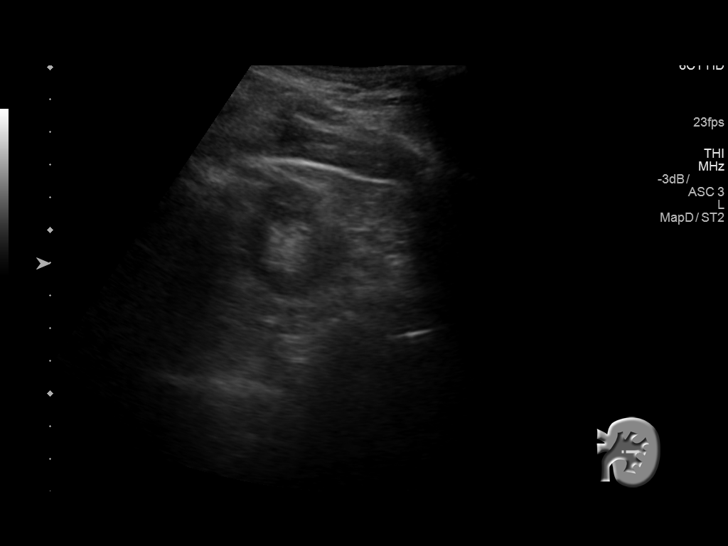
[im 45/50]
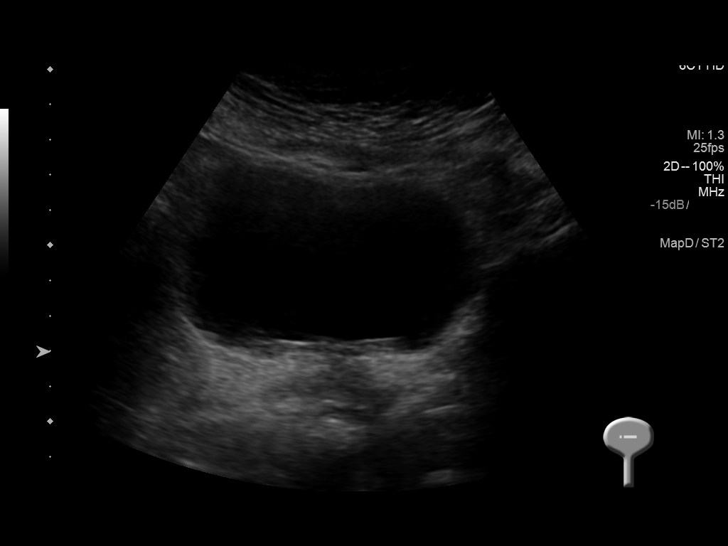
[im 50/50]
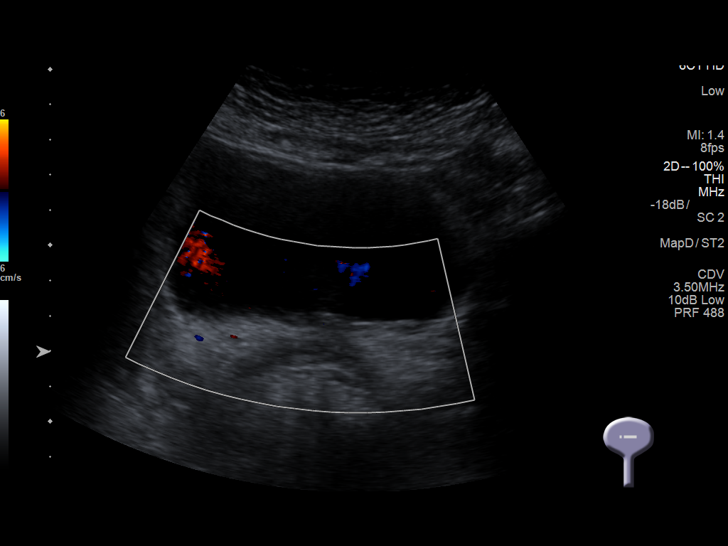

[14 of 25 positions shown; findings below may reference images not displayed]

FINDINGS: Right Kidney:

Length: 9.1 cm. Echogenicity within normal limits. No mass or
hydronephrosis visualized.

Left Kidney:

Length: 9.3 cm. Echogenicity within normal limits. No mass or
hydronephrosis visualized. Exophytic benign-appearing cyst off of
the superior pole of the left kidney measures 1.7 cm.

Bladder:

Again seen is a hyperechoic structure within the right hand side
dependent portion of the bladder, which measures 1.8 x 1.5 cm.
Posterior shadowing is noted. Bilateral ureteral jets are present.
IMPRESSION: Normal appearance of the right kidney.

Normal appearance of the left kidney, apart from 1.7 cm
benign-appearing cyst.

1.8 cm right bladder calculus.

## 2021-12-06 NOTE — Progress Notes (Signed)
X °
